# Patient Record
Sex: Male | Born: 1970 | Race: White | Hispanic: No | Marital: Single | Smoking: Former smoker
Health system: Southern US, Community
[De-identification: ages and names within clinical notes are randomized; demographics above are authoritative.]

## PROBLEM LIST (undated history)

## (undated) DIAGNOSIS — F419 Anxiety disorder, unspecified: Secondary | ICD-10-CM

## (undated) DIAGNOSIS — F32A Depression, unspecified: Secondary | ICD-10-CM

## (undated) HISTORY — DX: Depression, unspecified: F32.A

## (undated) HISTORY — DX: Anxiety disorder, unspecified: F41.9

## (undated) HISTORY — PX: CARDIAC SURGERY: SHX584

---

## 2006-03-15 ENCOUNTER — Ambulatory Visit: Payer: Self-pay | Admitting: Family Medicine

## 2006-03-16 ENCOUNTER — Other Ambulatory Visit: Payer: Self-pay

## 2006-03-16 ENCOUNTER — Inpatient Hospital Stay: Payer: Self-pay | Admitting: Internal Medicine

## 2007-03-19 IMAGING — CT CT CHEST W/ CM
1 series · 15 of 33 positions shown, 19 images · non-contrast
Comparison: none

REASON FOR EXAM: Follow-up empyema
COMMENTS:

[Series 2: soft tissue · axial · 0.71mm/px · z∈[-698,-424]mm · 15 of 65 slices shown, 19 images]
[im 5/65  mediastinal]
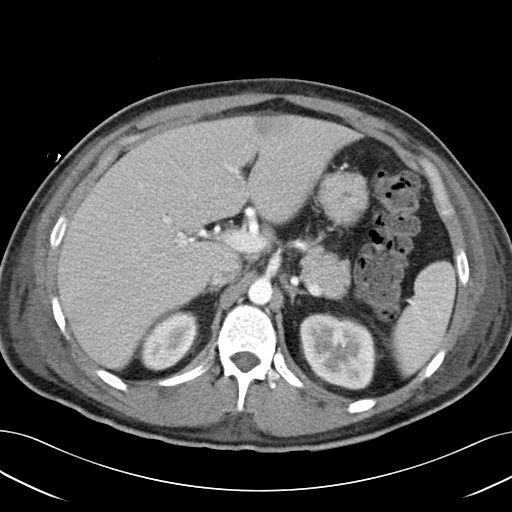
[im 5/65  lung]
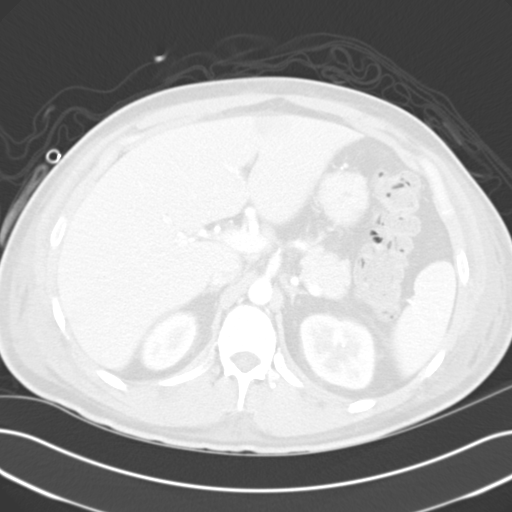
[im 10/65  lung]
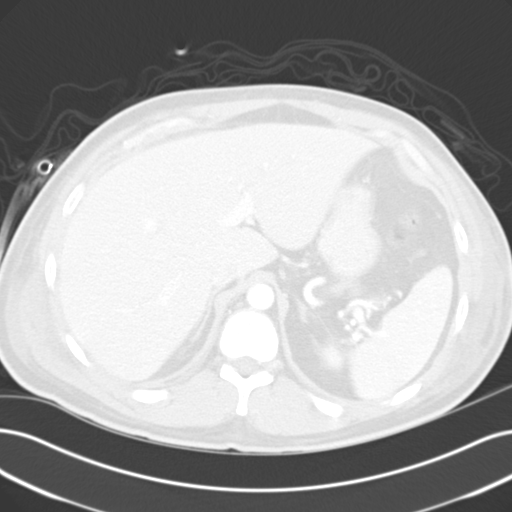
[im 13/65  lung]
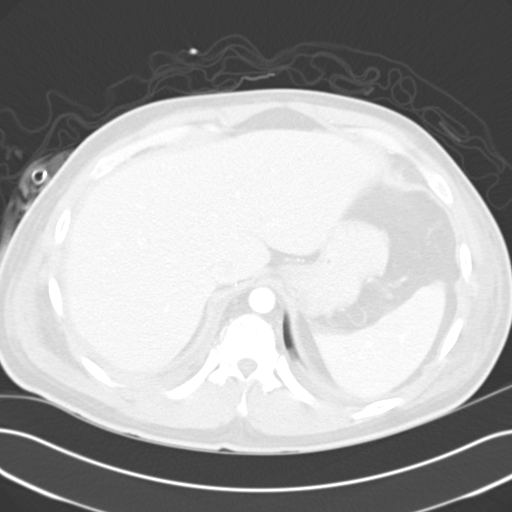
[im 17/65  lung]
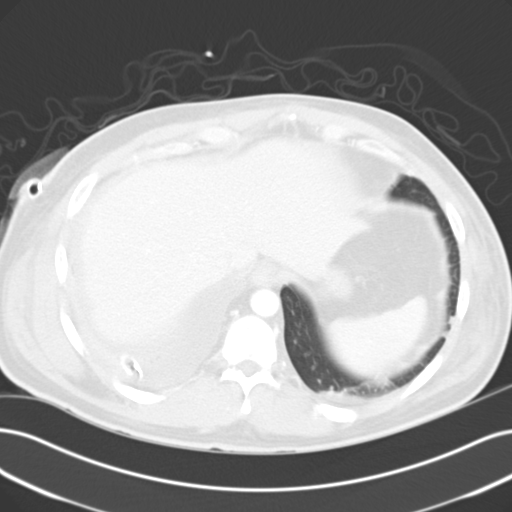
[im 22/65  mediastinal]
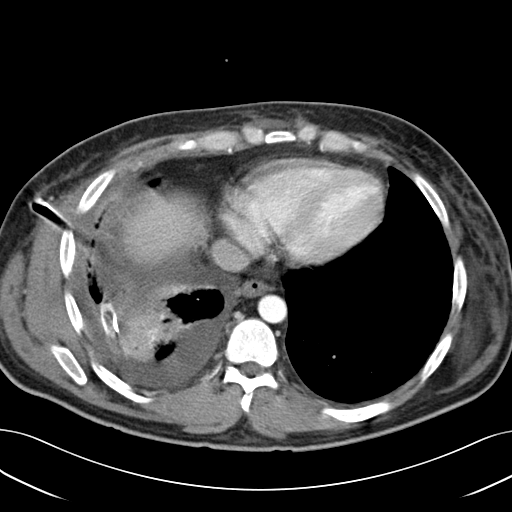
[im 22/65  lung]
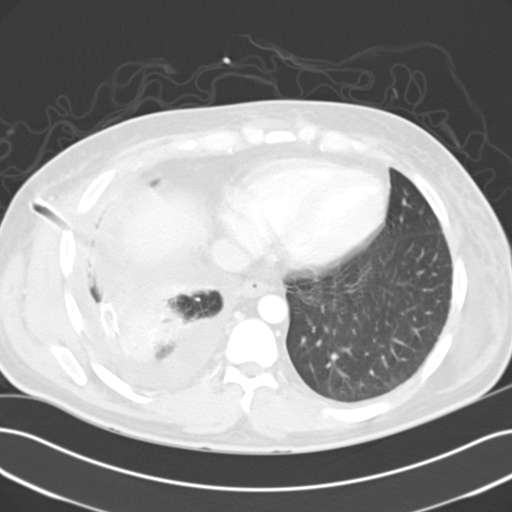
[im 26/65  lung]
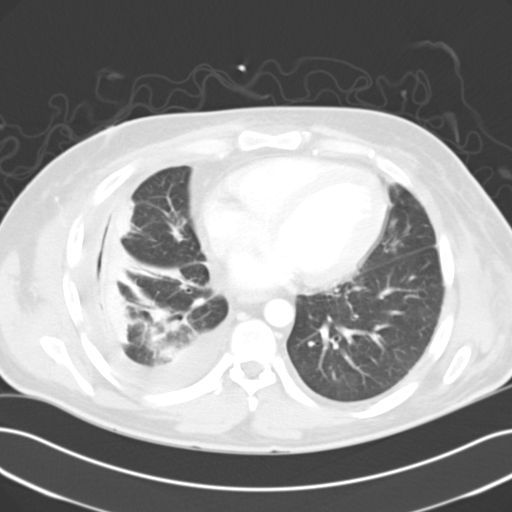
[im 29/65  lung]
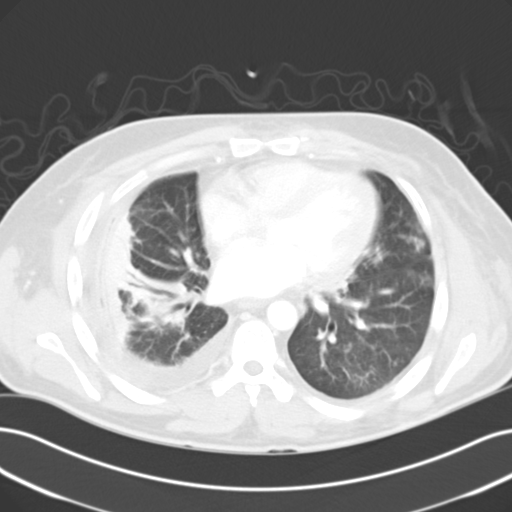
[im 34/65  lung]
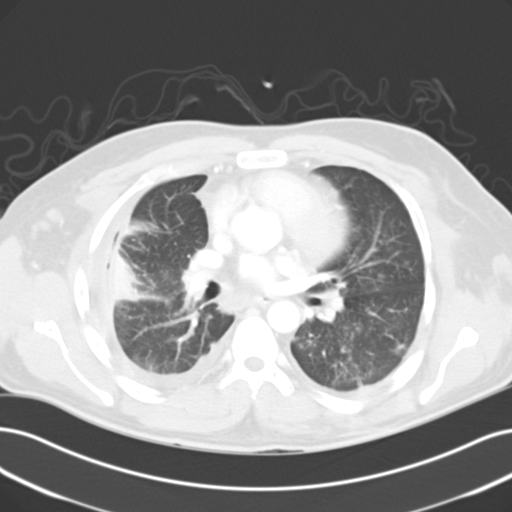
[im 36/65  mediastinal]
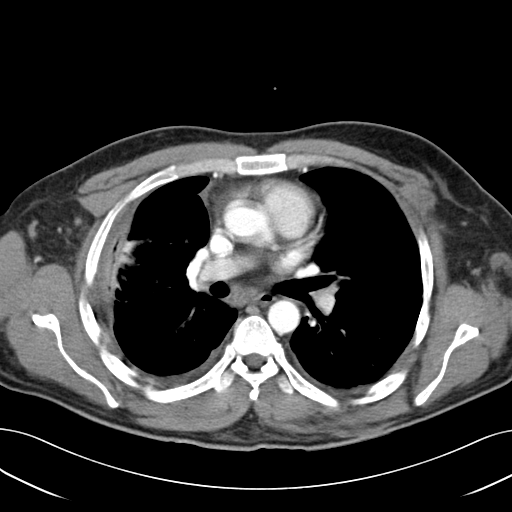
[im 36/65  lung]
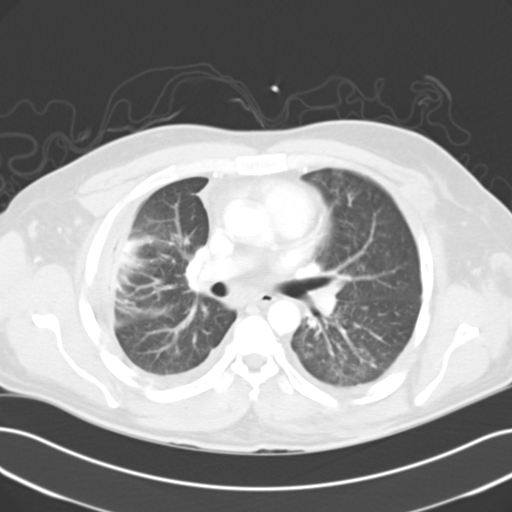
[im 39/65  lung]
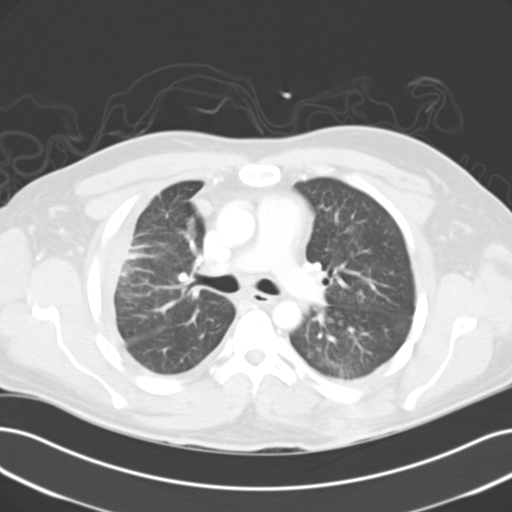
[im 43/65  lung]
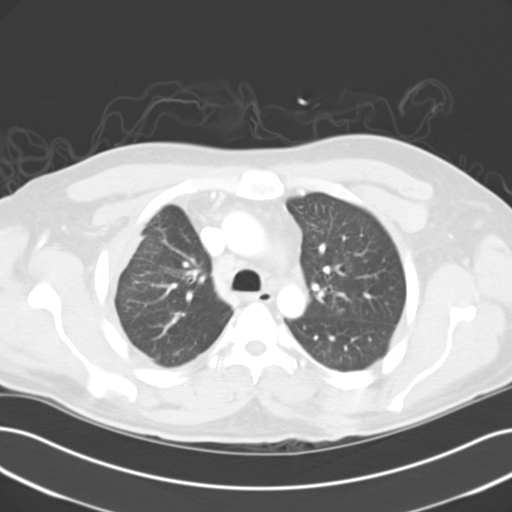
[im 48/65  lung]
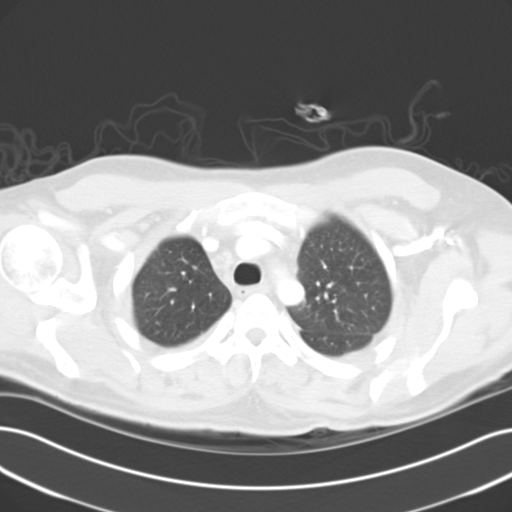
[im 52/65  mediastinal]
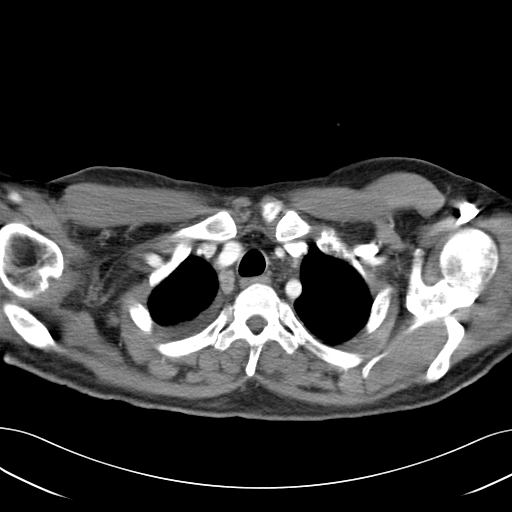
[im 52/65  lung]
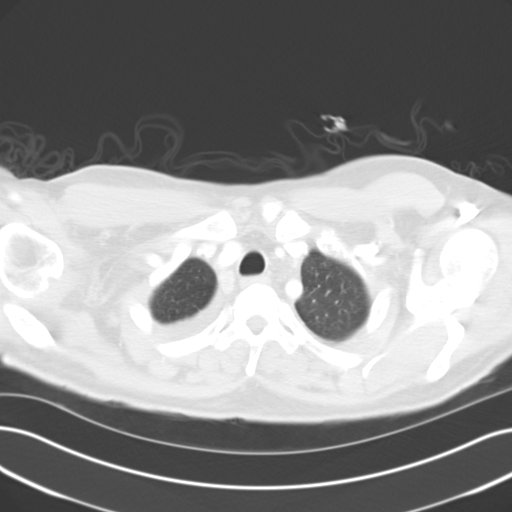
[im 55/65  lung]
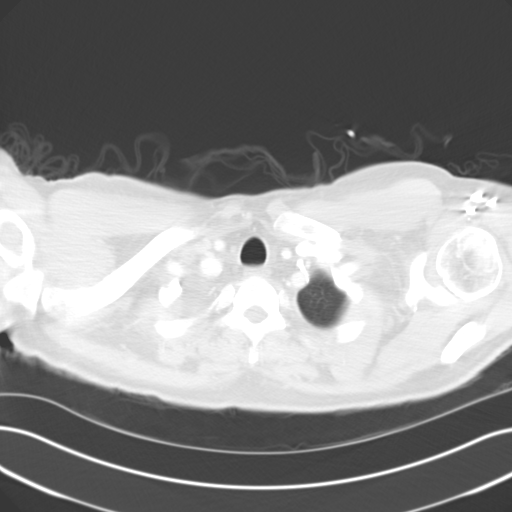
[im 60/65  lung]
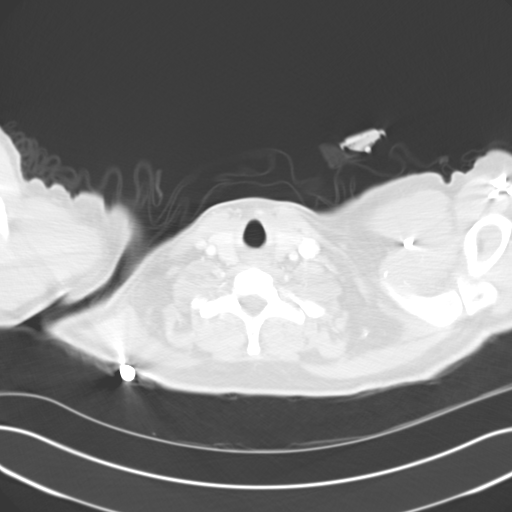

[15 of 33 positions shown; findings below may reference images not displayed]

PROCEDURE:     CT  - CT CHEST WITH CONTRAST  - March 20, 2006  [DATE]

RESULT:     The study is performed in follow-up of  CT scan [DATE].
In the interim, the patient has undergone placement of a large caliber chest
tube on the RIGHT for drainage of an empyema.

The large air/fluid collection has resolved. There is a small amount of
pleural space air and there is a small amount of pleural fluid remaining.
There is atelectatic lung in the region of the previous empyema. On the LEFT
there is a tiny amount of density in the posterior costophrenic gutter on
the LEFT which is new.

The cardiac chambers are top normal in size. The caliber of the thoracic
aorta is normal. There is an enlarged, RIGHT retrosternal lymph node, seen
best on image #26, which has increased in size since the prior study. It
measures approximately 1.0 x 1.5 cm. There are enlarged, subcarinal lymph
nodes present. I do not see bulky hilar lymph nodes. At lung window
settings, the aforementioned changes are noted in the RIGHT lung. On the
LEFT, there is some patchy density in the interstitium of the lower lobe.

Within the upper abdomen, the observed portions of the liver are within the
limits of normal. There are no adrenal masses.
IMPRESSION: 1.     There has been marked improvement in the appearance of the RIGHT
pleural space with the loculated air/fluid level no longer seen. However,
there is a small amount of pleural space air adjacent to the chest tube and
there is a small to moderate sized pleural effusion on the RIGHT. The chest
tube tip lies in the region of the posterior costophrenic gutter on the
RIGHT.
2.     On the LEFT, there is a small, pleural effusion layering posteriorly
and mildly increased interstitial markings have become more conspicuous
since the prior study.
3.     There are enlarged lymph nodes in the retrosternal region as well as
in the subcarinal region and in the RIGHT peritracheal region. These are
more conspicuous than on the prior study.

## 2007-08-31 IMAGING — CR DG CHEST 1V
1 series · 1 of 1 positions shown · non-contrast
Comparison: none

REASON FOR EXAM: empyema
COMMENTS:

[view not recorded]
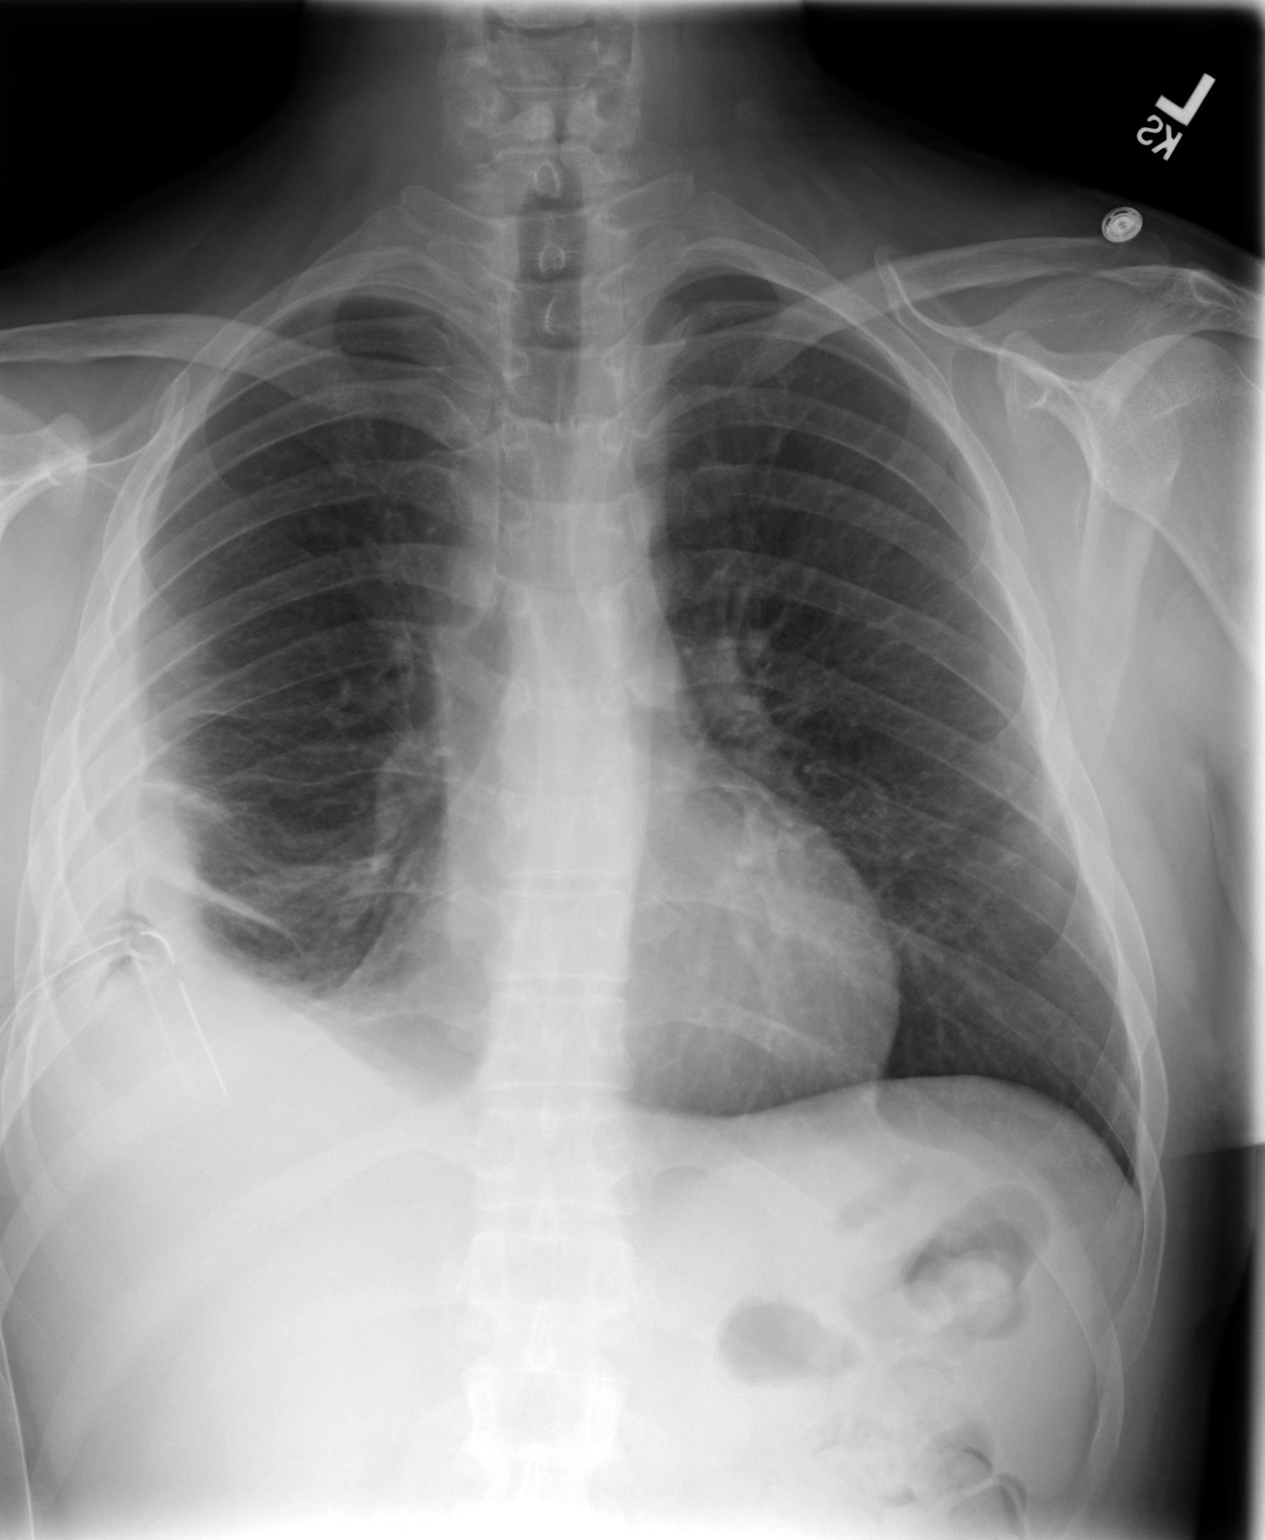

[1 of 1 positions shown; findings below may reference images not displayed]

PROCEDURE:     DXR - DXR CHEST 1 VIEWAP OR PA  - March 26, 2006  [DATE]

RESULT:          Comparison is made to a prior study dated 03/24/2006.

A RIGHT-sided chest tube is appreciated.  Surrounding consolidative density
is again appreciated that, when compared to the previous study, is
relatively unchanged.  The patient is taking a deeper inspiration.  No new
focal regions of consolidation or new focal infiltrates are demonstrated.
The cardiac silhouette is within normal limits.  The visualized bony
skeleton is unremarkable.
IMPRESSION: RIGHT-sided chest tube as described above.  The patient
is taking a deeper inspiration, and otherwise, there is no significant
change in the chest radiograph.

## 2020-11-09 NOTE — Progress Notes (Signed)
BP (!) 144/91   Pulse 79   Temp 97.7 F (36.5 C)   Ht 5' 6.69" (1.694 m)   Wt 176 lb 6 oz (80 kg)   SpO2 99%   BMI 27.88 kg/m    Subjective:    Patient ID: Carl Lowe, male    DOB: May 14, 1971, 50 y.o.   MRN: [id number]  HPI: Carl Lowe is a 50 y.o. male  Chief Complaint  Patient presents with  . Establish Care  . Gastroesophageal Reflux  . Anxiety   Patient presents to clinic to establish care with new PCP.  Patient reports a history of anxiety, hypertension (was previously on HCTZ but has been off of the medication for about 4 years since he got out of prison), constipation when he gets xanax or klonopin from friends, and acid reflux.  Patient states he has had heart surgery but doesn't know what it was at [facility] in [facility].  Patient denies a history of: Elevated Cholesterol, Thyroid problems, Depression, Neurological problems, and Abdominal problems.   ANXIETY/STRESS Patient states it has been going on for 4 months.  He has been self medicating via friends where he gets Xanax.  Duration:worse Anxious mood: yes  Excessive worrying: yes Irritability: yes  Sweating: yes Nausea: no Palpitations:Occasional Hyperventilation: Patient states it has happened 3 times Panic attacks: yes Patient states he has panic attacks 1 per month Agoraphobia: yes  Obscessions/compulsions: no Depressed mood: no Depression screen PHQ 2/9 11/10/2020  Decreased Interest 1  Down, Depressed, Hopeless 3  PHQ - 2 Score 4  Altered sleeping 2  Tired, decreased energy 0  Change in appetite 3  Feeling bad or failure about yourself  0  Trouble concentrating 1  Moving slowly or fidgety/restless 3  Suicidal thoughts 0  PHQ-9 Score 13  Difficult doing work/chores Extremely dIfficult   Anhedonia: no Weight changes: yes Insomnia: yes hard to fall asleep and hard to stay asleep.  Hypersomnia: yes Patient states he will be up for a couple of days at at time and is not able to eat.   Fatigue/loss of energy: no Feelings of worthlessness: no Feelings of guilt: no Impaired concentration/indecisiveness: yes Suicidal ideations: no  Crying spells: yes Recent Stressors/Life Changes: no   Relationship problems: no   Family stress: no     Financial stress: no    Job stress: yes    Recent death/loss: no  HYPERTENSION Patient denies checking blood pressure at home.  Doesn't currently have any symptoms. Denies HA, CP, SOB, dizziness, palpitations, visual changes, and lower extremity swelling.  GERD Patient states that he takes Omeprazole daily.  It doesn't always help to control his symptoms.    Relevant past medical, surgical, family and social history reviewed and updated as indicated. Interim medical history since our last visit reviewed. Allergies and medications reviewed and updated.  Review of Systems  Constitutional: Positive for unexpected weight change.  Eyes: Negative for visual disturbance.  Respiratory: Negative for shortness of breath.   Cardiovascular: Negative for chest pain and leg swelling.  Gastrointestinal:       Reflux  Neurological: Negative for light-headedness and headaches.  Psychiatric/Behavioral: Positive for decreased concentration and sleep disturbance. Negative for dysphoric mood and suicidal ideas. The patient is nervous/anxious.     Per HPI unless specifically indicated above     Objective:    BP (!) 144/91   Pulse 79   Temp 97.7 F (36.5 C)   Ht 5' 6.69" (1.694 m)  Wt 176 lb 6 oz (80 kg)   SpO2 99%   BMI 27.88 kg/m   Wt Readings from Last 3 Encounters:  11/10/20 176 lb 6 oz (80 kg)    Physical Exam Vitals and nursing note reviewed.  Constitutional:      General: He is not in acute distress.    Appearance: Normal appearance. He is not ill-appearing, toxic-appearing or diaphoretic.  HENT:     Head: Normocephalic.     Right Ear: External ear normal.     Left Ear: External ear normal.     Nose: Nose normal. No  congestion or rhinorrhea.     Mouth/Throat:     Mouth: Mucous membranes are moist.  Eyes:     General:        Right eye: No discharge.        Left eye: No discharge.     Extraocular Movements: Extraocular movements intact.     Conjunctiva/sclera: Conjunctivae normal.     Pupils: Pupils are equal, round, and reactive to light.  Cardiovascular:     Rate and Rhythm: Normal rate and regular rhythm.     Heart sounds: No murmur heard.   Pulmonary:     Effort: Pulmonary effort is normal. No respiratory distress.     Breath sounds: Normal breath sounds. No wheezing, rhonchi or rales.  Abdominal:     General: Abdomen is flat. Bowel sounds are normal.  Musculoskeletal:     Cervical back: Normal range of motion and neck supple.  Skin:    General: Skin is warm and dry.     Capillary Refill: Capillary refill takes less than 2 seconds.  Neurological:     General: No focal deficit present.     Mental Status: He is alert and oriented to person, place, and time.  Psychiatric:        Mood and Affect: Affect is blunt.        Speech: Speech is delayed.        Behavior: Behavior normal.        Thought Content: Thought content normal.        Judgment: Judgment normal.     No results found for this or any previous visit.    Assessment & Plan:   Problem List Items Addressed This Visit      Cardiovascular and Mediastinum   Hypertension    Chronic.  Uncontrolled.  Will start Lisinopril 10mg  daily.  Side effects and benefits of medication discussed with patient during visit. Return in one month for reevaluation.       Relevant Medications   lisinopril (ZESTRIL) 10 MG tablet     Digestive   Acid reflux    Chronic.  Increased omeprazole to 40mg  due to patient still having symptoms of reflux.  If symptoms are still not controlled can consider referral to GI for endoscopy. Follow up in 1 month for reevaluation.       Relevant Medications   omeprazole (PRILOSEC) 40 MG capsule     Other    Anxiety    Chronic.  Patient referred to psychiatry for management of anxiety.  Follow up in 1 month.       Relevant Orders   Ambulatory referral to Psychiatry    Other Visit Diagnoses    Encounter to establish care    -  Primary       Follow up plan: Return in about 1 month (around 12/10/2020) for BP Check, Depression/Anxiety FU.   A total  of 40 minutes were spent on this encounter today.  When total time is documented, this includes both the face-to-face and non-face-to-face time personally spent before, during and after the visit on the date of the encounter.

## 2020-11-10 ENCOUNTER — Ambulatory Visit (INDEPENDENT_AMBULATORY_CARE_PROVIDER_SITE_OTHER): Payer: Self-pay | Admitting: Nurse Practitioner

## 2020-11-10 ENCOUNTER — Other Ambulatory Visit: Payer: Self-pay

## 2020-11-10 ENCOUNTER — Encounter: Payer: Self-pay | Admitting: Nurse Practitioner

## 2020-11-10 VITALS — BP 144/91 | HR 79 | Temp 97.7°F | Ht 66.69 in | Wt 176.4 lb

## 2020-11-10 DIAGNOSIS — K219 Gastro-esophageal reflux disease without esophagitis: Secondary | ICD-10-CM | POA: Insufficient documentation

## 2020-11-10 DIAGNOSIS — F419 Anxiety disorder, unspecified: Secondary | ICD-10-CM

## 2020-11-10 DIAGNOSIS — I10 Essential (primary) hypertension: Secondary | ICD-10-CM | POA: Insufficient documentation

## 2020-11-10 DIAGNOSIS — Z7689 Persons encountering health services in other specified circumstances: Secondary | ICD-10-CM

## 2020-11-10 MED ORDER — LISINOPRIL 10 MG PO TABS: 10.0000 mg | ORAL_TABLET | Freq: Every day | ORAL | 0 refills | Status: DC

## 2020-11-10 MED ORDER — OMEPRAZOLE 40 MG PO CPDR: 40.0000 mg | DELAYED_RELEASE_CAPSULE | Freq: Every day | ORAL | 1 refills | Status: DC

## 2020-11-10 NOTE — Assessment & Plan Note (Signed)
Chronic.  Uncontrolled.  Will start Lisinopril 10mg  daily.  Side effects and benefits of medication discussed with patient during visit. Return in one month for reevaluation.

## 2020-11-10 NOTE — Assessment & Plan Note (Signed)
Chronic.  Increased omeprazole to 40mg  due to patient still having symptoms of reflux.  If symptoms are still not controlled can consider referral to GI for endoscopy. Follow up in 1 month for reevaluation.

## 2020-11-10 NOTE — Assessment & Plan Note (Signed)
Chronic.  Patient referred to psychiatry for management of anxiety.  Follow up in 1 month.

## 2020-12-09 NOTE — Progress Notes (Deleted)
   There were no vitals taken for this visit.   Subjective:    Patient ID: Carl Lowe, male    DOB: July 15, 1971, 50 y.o.   MRN: [id number]  HPI: Carl Lowe is a 50 y.o. male  No chief complaint on file.  HYPERTENSION Hypertension status: {Blank single:19197::"controlled","uncontrolled","better","worse","exacerbated","stable"}  Satisfied with current treatment? {Blank single:19197::"yes","no"} Duration of hypertension: {Blank single:19197::"chronic","months","years"} BP monitoring frequency:  {Blank single:19197::"not checking","rarely","daily","weekly","monthly","a few times a day","a few times a week","a few times a month"} BP range:  BP medication side effects:  {Blank single:19197::"yes","no"} Medication compliance: {Blank single:19197::"excellent compliance","good compliance","fair compliance","poor compliance"} Previous BP meds:{Blank multiple:19196::"none","amlodipine","amlodipine/benazepril","atenolol","benazepril","benazepril/HCTZ","bisoprolol (bystolic)","carvedilol","chlorthalidone","clonidine","diltiazem","exforge HCT","HCTZ","irbesartan (avapro)","labetalol","lisinopril","lisinopril-HCTZ","losartan (cozaar)","methyldopa","nifedipine","olmesartan (benicar)","olmesartan-HCTZ","quinapril","ramipril","spironalactone","tekturna","valsartan","valsartan-HCTZ","verapamil"} Aspirin: {Blank single:19197::"yes","no"} Recurrent headaches: {Blank single:19197::"yes","no"} Visual changes: {Blank single:19197::"yes","no"} Palpitations: {Blank single:19197::"yes","no"} Dyspnea: {Blank single:19197::"yes","no"} Chest pain: {Blank single:19197::"yes","no"} Lower extremity edema: {Blank single:19197::"yes","no"} Dizzy/lightheaded: {Blank single:19197::"yes","no"}  ANXIETY  Relevant past medical, surgical, family and social history reviewed and updated as indicated. Interim medical history since our last visit reviewed. Allergies and medications reviewed and  updated.  Review of Systems  Per HPI unless specifically indicated above     Objective:    There were no vitals taken for this visit.  Wt Readings from Last 3 Encounters:  11/10/20 176 lb 6 oz (80 kg)    Physical Exam  No results found for this or any previous visit.    Assessment & Plan:   Problem List Items Addressed This Visit      Cardiovascular and Mediastinum   Hypertension - Primary     Other   Anxiety       Follow up plan: No follow-ups on file.

## 2020-12-10 ENCOUNTER — Telehealth: Payer: Self-pay | Admitting: Nurse Practitioner

## 2020-12-10 ENCOUNTER — Other Ambulatory Visit: Payer: Self-pay

## 2020-12-10 ENCOUNTER — Ambulatory Visit: Payer: Self-pay | Admitting: Nurse Practitioner

## 2020-12-10 DIAGNOSIS — I10 Essential (primary) hypertension: Secondary | ICD-10-CM

## 2020-12-10 DIAGNOSIS — F419 Anxiety disorder, unspecified: Secondary | ICD-10-CM

## 2020-12-10 MED ORDER — LISINOPRIL 10 MG PO TABS: 10.0000 mg | ORAL_TABLET | Freq: Every day | ORAL | 0 refills | Status: DC

## 2020-12-10 NOTE — Telephone Encounter (Signed)
Refill sent for patient.  He has only been on the medication for 4 days and then he lost the pills.  Will follow up in 1 month.

## 2021-01-11 ENCOUNTER — Ambulatory Visit: Payer: Self-pay | Admitting: Nurse Practitioner

## 2021-01-11 NOTE — Progress Notes (Deleted)
   There were no vitals taken for this visit.   Subjective:    Patient ID: Carl Lowe, male    DOB: 04-18-1971, 50 y.o.   MRN: [id number]  HPI: Carl Lowe is a 50 y.o. male  No chief complaint on file.  HYPERTENSION Hypertension status: {Blank single:19197::"controlled","uncontrolled","better","worse","exacerbated","stable"}  Satisfied with current treatment? {Blank single:19197::"yes","no"} Duration of hypertension: {Blank single:19197::"chronic","months","years"} BP monitoring frequency:  {Blank single:19197::"not checking","rarely","daily","weekly","monthly","a few times a day","a few times a week","a few times a month"} BP range:  BP medication side effects:  {Blank single:19197::"yes","no"} Medication compliance: {Blank single:19197::"excellent compliance","good compliance","fair compliance","poor compliance"} Previous BP meds:{Blank multiple:19196::"none","amlodipine","amlodipine/benazepril","atenolol","benazepril","benazepril/HCTZ","bisoprolol (bystolic)","carvedilol","chlorthalidone","clonidine","diltiazem","exforge HCT","HCTZ","irbesartan (avapro)","labetalol","lisinopril","lisinopril-HCTZ","losartan (cozaar)","methyldopa","nifedipine","olmesartan (benicar)","olmesartan-HCTZ","quinapril","ramipril","spironalactone","tekturna","valsartan","valsartan-HCTZ","verapamil"} Aspirin: {Blank single:19197::"yes","no"} Recurrent headaches: {Blank single:19197::"yes","no"} Visual changes: {Blank single:19197::"yes","no"} Palpitations: {Blank single:19197::"yes","no"} Dyspnea: {Blank single:19197::"yes","no"} Chest pain: {Blank single:19197::"yes","no"} Lower extremity edema: {Blank single:19197::"yes","no"} Dizzy/lightheaded: {Blank single:19197::"yes","no"}  DEPRESSION/ANXIETY Relevant past medical, surgical, family and social history reviewed and updated as indicated. Interim medical history since our last visit reviewed. Allergies and medications reviewed and  updated.  Review of Systems  Per HPI unless specifically indicated above     Objective:    There were no vitals taken for this visit.  Wt Readings from Last 3 Encounters:  11/10/20 176 lb 6 oz (80 kg)    Physical Exam  No results found for this or any previous visit.    Assessment & Plan:   Problem List Items Addressed This Visit       Cardiovascular and Mediastinum   Hypertension - Primary     Other   Anxiety     Follow up plan: No follow-ups on file.

## 2021-01-27 ENCOUNTER — Ambulatory Visit
Admission: EM | Admit: 2021-01-27 | Discharge: 2021-01-27 | Disposition: A | Discharge status: Home / Self Care | Payer: Self-pay | Attending: Sports Medicine | Admitting: Sports Medicine

## 2021-01-27 ENCOUNTER — Other Ambulatory Visit: Payer: Self-pay

## 2021-01-27 ENCOUNTER — Encounter: Payer: Self-pay | Admitting: Emergency Medicine

## 2021-01-27 DIAGNOSIS — F419 Anxiety disorder, unspecified: Secondary | ICD-10-CM

## 2021-01-27 NOTE — ED Triage Notes (Signed)
Pt presents today with c/o anxiety and insomnia. He reports chronic anxiety with an increase in anxiety causing insomnia. He has not been able to sleep in three days.

## 2021-01-27 NOTE — ED Provider Notes (Signed)
[facility]    CSN: [id number] Arrival date & time: 01/27/21  1239      History   Chief Complaint Chief Complaint  Patient presents with   Anxiety    HPI Carl Lowe is a 50 y.o. male.   Patient presents looking for a prescription for Xanax.  Review of the medical record indicates that he does have a primary care provider that he was supposed to follow-up with in May and has not done so.  She referred him to psychiatry and he has not followed up there.  I told the patient that I was not comfortable prescribing any mood altering medications to him.  He needs to go through psychiatry to get them.  Patient was not examined and not evaluated.  This will be a no charge visit.  I advised him to call his primary care provider and get seen as soon as possible discussed all options for his mental health care.   Past Medical History:  Diagnosis Date   Anxiety    Depression     Patient Active Problem List   Diagnosis Date Noted   Hypertension 11/10/2020   Acid reflux 11/10/2020   Anxiety 11/10/2020    Past Surgical History:  Procedure Laterality Date   CARDIAC SURGERY         Home Medications    Prior to Admission medications   Medication Sig Start Date End Date Taking? Authorizing Provider  lisinopril (ZESTRIL) 10 MG tablet Take 1 tablet (10 mg total) by mouth daily. Patient not taking: Reported on 01/27/2021 12/10/20   Larae Grooms, NP  omeprazole (PRILOSEC) 40 MG capsule Take 1 capsule (40 mg total) by mouth daily. 11/10/20   Larae Grooms, NP    Family History Family History  Problem Relation Age of Onset   Hypertension Mother    Bipolar disorder Father    Heart disease Father    Dementia Father    COPD Father    Anxiety disorder Brother     Social History Social History   Tobacco Use   Smoking status: Former    Pack years: 0.00   Smokeless tobacco: Current    Types: Snuff  Vaping Use   Vaping Use: Never used  Substance  Use Topics   Alcohol use: Never   Drug use: Yes    Comment: clonapen, xanax "any benzos" off the street, doesnt' want to do that anymore     Allergies   Patient has no known allergies.   Review of Systems Review of Systems   Physical Exam Triage Vital Signs ED Triage Vitals [01/27/21 1313]  Enc Vitals Group     BP      Pulse      Resp      Temp      Temp src      SpO2      Weight      Height      Head Circumference      Peak Flow      Pain Score 0     Pain Loc      Pain Edu?      Excl. in GC?    No data found.  Updated Vital Signs BP (!) 150/106 (BP Location: Left Arm)   Pulse 82   Temp 98.2 F (36.8 C) (Oral)   Resp 20   SpO2 100%   Visual Acuity Right Eye Distance:   Left Eye Distance:   Bilateral Distance:    Right  Eye Near:   Left Eye Near:    Bilateral Near:     Physical Exam   UC Treatments / Results  Labs (all labs ordered are listed, but only abnormal results are displayed) Labs Reviewed - No data to display  EKG   Radiology No results found.  Procedures Procedures (including critical care time)  Medications Ordered in UC Medications - No data to display  Initial Impression / Assessment and Plan / UC Course  I have reviewed the triage vital signs and the nursing notes.  Pertinent labs & imaging results that were available during my care of the patient were reviewed by me and considered in my medical decision making (see chart for details).    Final Clinical Impressions(s) / UC Diagnoses   Final diagnoses:  Anxiety   Discharge Instructions   None    ED Prescriptions   None    PDMP not reviewed this encounter.   Delton See, MD 01/27/21 1345

## 2022-02-11 DIAGNOSIS — R69 Illness, unspecified: Secondary | ICD-10-CM | POA: Diagnosis not present

## 2022-02-11 DIAGNOSIS — F411 Generalized anxiety disorder: Secondary | ICD-10-CM | POA: Diagnosis not present

## 2022-02-11 DIAGNOSIS — F4310 Post-traumatic stress disorder, unspecified: Secondary | ICD-10-CM | POA: Diagnosis not present

## 2022-05-31 DIAGNOSIS — F411 Generalized anxiety disorder: Secondary | ICD-10-CM | POA: Diagnosis not present

## 2022-05-31 DIAGNOSIS — R69 Illness, unspecified: Secondary | ICD-10-CM | POA: Diagnosis not present

## 2022-05-31 DIAGNOSIS — F339 Major depressive disorder, recurrent, unspecified: Secondary | ICD-10-CM | POA: Diagnosis not present

## 2022-06-05 ENCOUNTER — Emergency Department: Payer: 59

## 2022-06-05 ENCOUNTER — Inpatient Hospital Stay
Admission: EM | Admit: 2022-06-05 | Discharge: 2022-06-06 | DRG: 885 | Disposition: A | Discharge status: Rehabilitation Facility | Payer: 59 | Attending: Internal Medicine | Admitting: Internal Medicine

## 2022-06-05 ENCOUNTER — Other Ambulatory Visit: Payer: Self-pay

## 2022-06-05 DIAGNOSIS — G928 Other toxic encephalopathy: Secondary | ICD-10-CM | POA: Diagnosis present

## 2022-06-05 DIAGNOSIS — M6282 Rhabdomyolysis: Secondary | ICD-10-CM | POA: Diagnosis not present

## 2022-06-05 DIAGNOSIS — F419 Anxiety disorder, unspecified: Secondary | ICD-10-CM | POA: Diagnosis not present

## 2022-06-05 DIAGNOSIS — F23 Brief psychotic disorder: Secondary | ICD-10-CM | POA: Diagnosis not present

## 2022-06-05 DIAGNOSIS — Z79899 Other long term (current) drug therapy: Secondary | ICD-10-CM | POA: Diagnosis not present

## 2022-06-05 DIAGNOSIS — R69 Illness, unspecified: Secondary | ICD-10-CM | POA: Diagnosis not present

## 2022-06-05 DIAGNOSIS — Z20822 Contact with and (suspected) exposure to covid-19: Secondary | ICD-10-CM | POA: Diagnosis not present

## 2022-06-05 DIAGNOSIS — F32A Depression, unspecified: Secondary | ICD-10-CM | POA: Diagnosis not present

## 2022-06-05 DIAGNOSIS — R45851 Suicidal ideations: Secondary | ICD-10-CM | POA: Diagnosis not present

## 2022-06-05 DIAGNOSIS — Z8249 Family history of ischemic heart disease and other diseases of the circulatory system: Secondary | ICD-10-CM | POA: Diagnosis not present

## 2022-06-05 DIAGNOSIS — R0902 Hypoxemia: Secondary | ICD-10-CM | POA: Diagnosis not present

## 2022-06-05 DIAGNOSIS — M4182 Other forms of scoliosis, cervical region: Secondary | ICD-10-CM | POA: Diagnosis not present

## 2022-06-05 DIAGNOSIS — Z818 Family history of other mental and behavioral disorders: Secondary | ICD-10-CM

## 2022-06-05 DIAGNOSIS — R410 Disorientation, unspecified: Secondary | ICD-10-CM

## 2022-06-05 DIAGNOSIS — F410 Panic disorder [episodic paroxysmal anxiety] without agoraphobia: Secondary | ICD-10-CM | POA: Diagnosis not present

## 2022-06-05 DIAGNOSIS — M50322 Other cervical disc degeneration at C5-C6 level: Secondary | ICD-10-CM | POA: Diagnosis not present

## 2022-06-05 DIAGNOSIS — G9340 Encephalopathy, unspecified: Secondary | ICD-10-CM | POA: Diagnosis present

## 2022-06-05 DIAGNOSIS — J9601 Acute respiratory failure with hypoxia: Secondary | ICD-10-CM | POA: Diagnosis present

## 2022-06-05 DIAGNOSIS — R9431 Abnormal electrocardiogram [ECG] [EKG]: Secondary | ICD-10-CM | POA: Diagnosis not present

## 2022-06-05 DIAGNOSIS — R4182 Altered mental status, unspecified: Secondary | ICD-10-CM | POA: Diagnosis not present

## 2022-06-05 DIAGNOSIS — F17220 Nicotine dependence, chewing tobacco, uncomplicated: Secondary | ICD-10-CM | POA: Diagnosis present

## 2022-06-05 DIAGNOSIS — R443 Hallucinations, unspecified: Secondary | ICD-10-CM | POA: Diagnosis not present

## 2022-06-05 DIAGNOSIS — N179 Acute kidney failure, unspecified: Secondary | ICD-10-CM | POA: Diagnosis present

## 2022-06-05 DIAGNOSIS — M50323 Other cervical disc degeneration at C6-C7 level: Secondary | ICD-10-CM | POA: Diagnosis not present

## 2022-06-05 DIAGNOSIS — M4802 Spinal stenosis, cervical region: Secondary | ICD-10-CM | POA: Diagnosis not present

## 2022-06-05 LAB — COMPREHENSIVE METABOLIC PANEL
ALT: 26 U/L (ref 0–44)
AST: 53 U/L — ABNORMAL HIGH (ref 15–41)
Albumin: 4.9 g/dL (ref 3.5–5.0)
Alkaline Phosphatase: 64 U/L (ref 38–126)
Anion gap: 14 (ref 5–15)
BUN: 22 mg/dL — ABNORMAL HIGH (ref 6–20)
CO2: 19 mmol/L — ABNORMAL LOW (ref 22–32)
Calcium: 9.6 mg/dL (ref 8.9–10.3)
Chloride: 102 mmol/L (ref 98–111)
Creatinine, Ser: 1.35 mg/dL — ABNORMAL HIGH (ref 0.61–1.24)
GFR, Estimated: >60 mL/min (ref >60)
Glucose, Bld: 102 mg/dL — ABNORMAL HIGH (ref 70–99)
Potassium: 3.9 mmol/L (ref 3.5–5.1)
Sodium: 135 mmol/L (ref 135–145)
Total Bilirubin: 1.4 mg/dL — ABNORMAL HIGH (ref 0.3–1.2)
Total Protein: 8.9 g/dL — ABNORMAL HIGH (ref 6.5–8.1)

## 2022-06-05 LAB — BLOOD GAS, VENOUS
Acid-Base Excess: 3.2 mmol/L — ABNORMAL HIGH (ref 0.0–2.0)
Bicarbonate: 27.9 mmol/L (ref 20.0–28.0)
O2 Saturation: 95.6 %
Patient temperature: 37
pCO2, Ven: 42 mmHg — ABNORMAL LOW (ref 44–60)
pH, Ven: 7.43 (ref 7.25–7.43)
pO2, Ven: 69 mmHg — ABNORMAL HIGH (ref 32–45)

## 2022-06-05 LAB — URINE DRUG SCREEN, QUALITATIVE (ARMC ONLY)
Amphetamines, Ur Screen: NOT DETECTED
Barbiturates, Ur Screen: NOT DETECTED
Benzodiazepine, Ur Scrn: POSITIVE — AB
Cannabinoid 50 Ng, Ur ~~LOC~~: POSITIVE — AB
Cocaine Metabolite,Ur ~~LOC~~: NOT DETECTED
MDMA (Ecstasy)Ur Screen: NOT DETECTED
Methadone Scn, Ur: NOT DETECTED
Opiate, Ur Screen: NOT DETECTED
Phencyclidine (PCP) Ur S: NOT DETECTED
Tricyclic, Ur Screen: POSITIVE — AB

## 2022-06-05 LAB — CK: Total CK: 730 U/L — ABNORMAL HIGH (ref 49–397)

## 2022-06-05 LAB — ETHANOL: Alcohol, Ethyl (B): <10 mg/dL (ref <10)

## 2022-06-05 LAB — ACETAMINOPHEN LEVEL: Acetaminophen (Tylenol), Serum: <10 ug/mL — ABNORMAL LOW (ref 10–30)

## 2022-06-05 LAB — PHOSPHORUS: Phosphorus: 4.7 mg/dL — ABNORMAL HIGH (ref 2.5–4.6)

## 2022-06-05 LAB — SALICYLATE LEVEL: Salicylate Lvl: <7 mg/dL — ABNORMAL LOW (ref 7.0–30.0)

## 2022-06-05 LAB — RESP PANEL BY RT-PCR (FLU A&B, COVID) ARPGX2
Influenza A by PCR: NEGATIVE
Influenza B by PCR: NEGATIVE
SARS Coronavirus 2 by RT PCR: NEGATIVE

## 2022-06-05 LAB — GLUCOSE, CAPILLARY: Glucose-Capillary: 84 mg/dL (ref 70–99)

## 2022-06-05 LAB — MRSA NEXT GEN BY PCR, NASAL: MRSA by PCR Next Gen: NOT DETECTED

## 2022-06-05 LAB — MAGNESIUM: Magnesium: 2.3 mg/dL (ref 1.7–2.4)

## 2022-06-05 LAB — PROCALCITONIN: Procalcitonin: <0.1 ng/mL

## 2022-06-05 MED ORDER — LORAZEPAM 2 MG/ML IJ SOLN
4.0000 mg | Freq: Once | INTRAMUSCULAR | Status: DC | PRN
  Filled 2022-06-05: qty 2

## 2022-06-05 MED ORDER — DOCUSATE SODIUM 100 MG PO CAPS: 100.0000 mg | ORAL_CAPSULE | Freq: Two times a day (BID) | ORAL | Status: DC | PRN

## 2022-06-05 MED ORDER — LACTATED RINGERS IV SOLN: INTRAVENOUS | Status: DC

## 2022-06-05 MED ORDER — ONDANSETRON HCL 4 MG/2ML IJ SOLN: 4.0000 mg | Freq: Four times a day (QID) | INTRAMUSCULAR | Status: DC | PRN

## 2022-06-05 MED ORDER — LORAZEPAM 2 MG/ML IJ SOLN
2.0000 mg | Freq: Once | INTRAMUSCULAR | Status: AC
  Administered 2022-06-05: 2 mg via INTRAMUSCULAR
  Filled 2022-06-05: qty 1

## 2022-06-05 MED ORDER — DROPERIDOL 2.5 MG/ML IJ SOLN
5.0000 mg | Freq: Once | INTRAMUSCULAR | Status: AC
  Administered 2022-06-05: 5 mg via INTRAMUSCULAR
  Filled 2022-06-05: qty 2

## 2022-06-05 MED ORDER — DIPHENHYDRAMINE HCL 50 MG/ML IJ SOLN
50.0000 mg | Freq: Once | INTRAMUSCULAR | Status: AC
  Administered 2022-06-05: 50 mg via INTRAMUSCULAR
  Filled 2022-06-05: qty 1

## 2022-06-05 MED ORDER — LORAZEPAM 2 MG/ML IJ SOLN: 2.0000 mg | Freq: Once | INTRAMUSCULAR | Status: DC

## 2022-06-05 MED ORDER — ZIPRASIDONE MESYLATE 20 MG IM SOLR
20.0000 mg | Freq: Once | INTRAMUSCULAR | Status: AC
  Administered 2022-06-05: 20 mg via INTRAMUSCULAR
  Filled 2022-06-05: qty 20

## 2022-06-05 MED ORDER — POLYETHYLENE GLYCOL 3350 17 G PO PACK: 17.0000 g | PACK | Freq: Every day | ORAL | Status: DC | PRN

## 2022-06-05 MED ORDER — LORAZEPAM 2 MG/ML IJ SOLN
1.0000 mg | Freq: Once | INTRAMUSCULAR | Status: AC
  Administered 2022-06-05: 1 mg via INTRAMUSCULAR
  Filled 2022-06-05: qty 1

## 2022-06-05 MED ORDER — ENOXAPARIN SODIUM 40 MG/0.4ML IJ SOSY
40.0000 mg | PREFILLED_SYRINGE | INTRAMUSCULAR | Status: DC
  Administered 2022-06-05: 40 mg via SUBCUTANEOUS
  Filled 2022-06-05: qty 0.4

## 2022-06-05 MED ORDER — ACETAMINOPHEN 325 MG PO TABS: 650.0000 mg | ORAL_TABLET | ORAL | Status: DC | PRN

## 2022-06-05 MED ORDER — LORAZEPAM 2 MG/ML IJ SOLN: 1.0000 mg | Freq: Once | INTRAMUSCULAR | Status: DC

## 2022-06-05 MED ORDER — CHLORHEXIDINE GLUCONATE CLOTH 2 % EX PADS
6.0000 | MEDICATED_PAD | Freq: Every day | CUTANEOUS | Status: DC
  Administered 2022-06-05 – 2022-06-06 (×2): 6 via TOPICAL

## 2022-06-05 MED ORDER — THIAMINE HCL 100 MG/ML IJ SOLN
100.0000 mg | Freq: Once | INTRAMUSCULAR | Status: AC
  Administered 2022-06-05: 100 mg via INTRAVENOUS
  Filled 2022-06-05: qty 2

## 2022-06-05 MED ORDER — DROPERIDOL 2.5 MG/ML IJ SOLN: 5.0000 mg | Freq: Once | INTRAMUSCULAR | Status: DC

## 2022-06-05 MED ORDER — MIDAZOLAM HCL 2 MG/2ML IJ SOLN
4.0000 mg | Freq: Once | INTRAMUSCULAR | Status: AC
  Administered 2022-06-05: 4 mg via INTRAMUSCULAR
  Filled 2022-06-05: qty 4

## 2022-06-05 MED ORDER — LORAZEPAM 2 MG/ML IJ SOLN
2.0000 mg | Freq: Once | INTRAMUSCULAR | Status: AC
  Administered 2022-06-05: 2 mg via INTRAVENOUS
  Filled 2022-06-05: qty 1

## 2022-06-05 MED ORDER — FOLIC ACID 5 MG/ML IJ SOLN
1.0000 mg | Freq: Every day | INTRAMUSCULAR | Status: DC
  Administered 2022-06-05 – 2022-06-06 (×2): 1 mg via INTRAVENOUS
  Filled 2022-06-05 (×2): qty 0.2

## 2022-06-05 MED ORDER — DEXMEDETOMIDINE HCL IN NACL 400 MCG/100ML IV SOLN
0.4000 ug/kg/h | INTRAVENOUS | Status: DC
  Administered 2022-06-05: 1 ug/kg/h via INTRAVENOUS
  Administered 2022-06-05: 0.4 ug/kg/h via INTRAVENOUS
  Administered 2022-06-05: 0.8 ug/kg/h via INTRAVENOUS
  Administered 2022-06-05: 1 ug/kg/h via INTRAVENOUS
  Administered 2022-06-06: 0.9 ug/kg/h via INTRAVENOUS
  Filled 2022-06-05 (×5): qty 100

## 2022-06-05 NOTE — ED Notes (Signed)
Pt continues to need constant redirection. He is attempting to get out of bed and still exhibiting paranoia. [employer] officer and NT remain at bedside. Provider aware.

## 2022-06-05 NOTE — ED Notes (Signed)
Received report from Nadine, RN after assisting with IM medications- pt in bed with security at bedside attempting to keep pt in bed- d/t the amount of medications this pt has received, plan is to move pt to a treatment room for closer monitoring- charge RN is aware and making arrangements

## 2022-06-05 NOTE — ED Notes (Signed)
Dr Wong at bedside

## 2022-06-05 NOTE — ED Notes (Signed)
Pt changed into facility scrubs and socks and belongings secured.

## 2022-06-05 NOTE — ED Notes (Signed)
EDP and RN will bring pt to CT. PRN ativan available if needed. Pt is resting slightly now.

## 2022-06-05 NOTE — ED Notes (Addendum)
Pt thrashing about in wheelchair.  Refuses to sit still for vital signs.  Pt states he can't stand or walk

## 2022-06-05 NOTE — ED Notes (Signed)
Pt talking to wall and pillow. Most of pt's conversations are about his warning of impending war. Pt picking/reaching at air and floor "catching" unknown objects

## 2022-06-05 NOTE — H&P (Signed)
NAME:  Carl Lowe, MRN:  [id number], DOB:  November 14, 1970, LOS: 0 ADMISSION DATE:  06/05/2022, CONSULTATION DATE:06/05/22 REFERRING MD: Dr. Modesto Charon , CHIEF COMPLAINT: Agitation    History of Present Illness:  This is a 51 yo male who presented to [facility] ER on 11/12 in police custody for psychiatric evaluation.  Per ER notes pt caused destruction at his home and expressed suicidal ideation prompting police notification.  It was also reported by the pts mother he started seeing a new doctor and was started on new medications.  However, the pts mother suspected the new medications were causing increased anxiety.  Therefore, she stopped giving the pt the medications, and the pt started hallucinating.   ED Course Upon arrival to the ER pt reported he had been having increased anxiety along with suicidal ideations with a plan to shoot himself. He requested psychiatric help for suicidal ideation  Pt evaluated by psychiatric NP who recommended psychiatric inpatient admission once pt medically cleared. Urine drug screen positive for benzos/ cannabinoid/tricyclics. While in the ER pt increasingly agitated having auditory and visual hallucinations along with paranoid behaviors. Pt required multiple doses of ativan/droperidol/geodon/ benadryl/versed.  However, he remained increasingly agitated requiring precedex gtt. PCCM team contacted for ICU admission.    Lab results: CO2 19/glucose 102/BUN 22/creatinine 1.35/ acetaminophen level <10/salicylate level <7.0/alcohol level/ <10/COVID-19 negative/Influenza negative   Initial EKG: NSR, hr 83, T wave inversion in anterior/inferior leads CT Head: Negative noncontrast Head CT.  CT Cervical Spine:  Mild motion artifact. No acute traumatic injury identified in the cervical spine. Cervical spine degeneration with mild if any spinal stenosis suspected.  Pertinent  Medical History  Anxiety  Depression   Significant Hospital Events: Including procedures,  antibiotic start and stop dates in addition to other pertinent events   11/12: Pt admitted with suicidal ideation and acute encephalopathy requiring precedex gtt   Interim History / Subjective:  As outlined above under significant events  Objective   Blood pressure (!) 133/114, pulse (!) 115, temperature 100.1 F (37.8 C), temperature source Oral, resp. rate (!) 28, height 5\' 6"  (1.676 m), weight 80 kg, SpO2 98 %.       No intake or output data in the 24 hours ending 06/05/22 13/12/23 Filed Weights   06/05/22 0026  Weight: 80 kg    Examination: General: Acutely-ill appearing male, sedated on precedex gtt HENT: Supple, no JVD  Lungs: Diminished throughout, even, non labored  Cardiovascular: Sinus tachycardia, s1s2, 2+ radial/2+ distal pulses, no edema  Abdomen: +BS x4, soft, non tender, non distended  Extremities: Normal bulk and tone Neuro: Sedated, not following commands, bilateral pupils pinpoint/sluggish   Resolved Hospital Problem list     Assessment & Plan:  Acute metabolic toxic encephalopathy suspected likely secondary to medication withdrawal  Suicidal ideation  Hx: Anxiety and Depression  - Continue precedex gtt and prn ativan for agitation/delirium  - Pt currently Involuntarily Committed  - Maintain 1:1 monitoring with bedside sitter for IVC and suicide precautions  - Psychiatry consulted appreciate input: will need psychiatric inpatient admission once medically cleared - Continue supportive care   Acute hypoxic respiratory failure likely secondary to aspiration  - Supplemental O2 for dyspnea and/or hypoxia  - Maintain O2 sats >92% - Pt HIGH RISK FOR MECHANICAL INTUBATION for airway protection   Mild acute kidney injury  Rhabdomyolysis  - Trend BMP and CK  - Replace electrolytes as indicated  - Monitor UOP - Avoid nephrotoxic medications - IV fluid resuscitation   -  Continuous telemetry monitoring   Best Practice (right click and "Reselect all SmartList  Selections" daily)   Diet/type: NPO DVT prophylaxis: LMWH GI prophylaxis: N/A Lines: N/A Foley:  N/A Code Status:  full code Last date of multidisciplinary goals of care discussion [N/A]  Labs   CBC: No results for input(s): "WBC", "NEUTROABS", "HGB", "HCT", "MCV", "PLT" in the last 168 hours.  Basic Metabolic Panel: Recent Labs  Lab 06/05/22 0034  NA 135  K 3.9  CL 102  CO2 19*  GLUCOSE 102*  BUN 22*  CREATININE 1.35*  CALCIUM 9.6   GFR: Estimated Creatinine Clearance: 64.4 mL/min (A) (by C-G formula based on SCr of 1.35 mg/dL (H)). No results for input(s): "PROCALCITON", "WBC", "LATICACIDVEN" in the last 168 hours.  Liver Function Tests: Recent Labs  Lab 06/05/22 0034  AST 53*  ALT 26  ALKPHOS 64  BILITOT 1.4*  PROT 8.9*  ALBUMIN 4.9   No results for input(s): "LIPASE", "AMYLASE" in the last 168 hours. No results for input(s): "AMMONIA" in the last 168 hours.  ABG No results found for: "PHART", "PCO2ART", "PO2ART", "HCO3", "TCO2", "ACIDBASEDEF", "O2SAT"   Coagulation Profile: No results for input(s): "INR", "PROTIME" in the last 168 hours.  Cardiac Enzymes: No results for input(s): "CKTOTAL", "CKMB", "CKMBINDEX", "TROPONINI" in the last 168 hours.  HbA1C: No results found for: "HGBA1C"  CBG: No results for input(s): "GLUCAP" in the last 168 hours.  Review of Systems:   Unable to assess pt confused   Past Medical History:  He,  has a past medical history of Anxiety and Depression.   Surgical History:   Past Surgical History:  Procedure Laterality Date   CARDIAC SURGERY       Social History:   reports that he has quit smoking. His smokeless tobacco use includes snuff. He reports current drug use. He reports that he does not drink alcohol.   Family History:  His family history includes Anxiety disorder in his brother; Bipolar disorder in his father; COPD in his father; Dementia in his father; Heart disease in his father; Hypertension in his  mother.   Allergies No Known Allergies   Home Medications  Prior to Admission medications   Medication Sig Start Date End Date Taking? Authorizing Provider  escitalopram (LEXAPRO) 5 MG tablet Take 5 mg by mouth daily. 02/14/22   [provider]  hydrOXYzine (ATARAX) 50 MG tablet Take 50 mg by mouth 3 (three) times daily. 05/31/22   [provider]  lisinopril (ZESTRIL) 10 MG tablet Take 1 tablet (10 mg total) by mouth daily. Patient not taking: Reported on 01/27/2021 12/10/20   Larae Grooms, NP  mirtazapine (REMERON) 7.5 MG tablet Take 7.5 mg by mouth at bedtime. 05/31/22   [provider]  omeprazole (PRILOSEC) 40 MG capsule Take 1 capsule (40 mg total) by mouth daily. Patient not taking: Reported on 06/05/2022 11/10/20   Larae Grooms, NP  QUEtiapine (SEROQUEL) 25 MG tablet Take 25 mg by mouth at bedtime. 05/31/22   [provider]     Critical care time: 45 minutes      Zada Girt, AGNP  Pulmonary/Critical Care Pager [phone] (please enter 7 digits) PCCM Consult Pager [phone] (please enter 7 digits)

## 2022-06-05 NOTE — ED Notes (Signed)
Pt sleeping. Precedex infusing at same rate.

## 2022-06-05 NOTE — ED Notes (Signed)
Pt continues to be agitated and attempts to walk down hall out of department. Provider aware.

## 2022-06-05 NOTE — ED Notes (Signed)
Pt continues to thrash in bed. ETCO2 placed. Security at bedside. Bed alarm on. Soft mitts placed on pt.

## 2022-06-05 NOTE — Progress Notes (Addendum)
1905 patient lethargic but awakes to voice alert to self and time can recall some incidents that brought him to the hospital no complaints of pain at this time patient has on mitts for safety and sitter at bedside 1140 patient bladder scanned 457 ml noted patient unable to urinated foley placed dark amber urine pr tolerated well

## 2022-06-05 NOTE — ED Notes (Signed)
Pt continues to exhibit paranoia and standing on bed.

## 2022-06-05 NOTE — Progress Notes (Signed)
Attempted to update pts brother Rafe Gumbs via telephone regarding pt condition, however he did not answer the telephone.  Left voicemail instructing Mr. Crotty to return my phone call.  Zada Girt, AGNP  Pulmonary/Critical Care Pager [phone] (please enter 7 digits) PCCM Consult Pager [phone] (please enter 7 digits)

## 2022-06-05 NOTE — ED Notes (Signed)
Bloodtubes in lab

## 2022-06-05 NOTE — ED Provider Notes (Signed)
7:20 AM Assumed care for off going team.   Blood pressure (!) 138/103, pulse (!) 116, temperature 100.1 F (37.8 C), temperature source Oral, resp. rate (!) 22, height 5\' 6"  (1.676 m), weight 80 kg, SpO2 95 %.  See their HPI for full report but in brief pending patient continues to have excited delirium.  Patient already been unable to obtain IV access or be on the cardiac or pulse ox monitor.  I am concerned about potential withdrawal from Xanax or alcohol given positive benzos on UDS.  Discussed with the ICU recommends trialing Precedex drip in order to get CT scan and rediscussing them after CT imaging.  7:52 AM patient given another 2 IV Ativan and then started on Precedex drip.  Continues to respond to internal stimuli  I reviewed patient's EKG from earlier where he was sinus rate of 83 without any ST elevation but did have T wave inversions in V2 to V6 aVL with QRS that was normal.  Given the positive TCAs I considered the possibility of TCA overdose as well however his QRS is not over 100 due to the need of bicarb  8:41 AM I come the patient to the CT scanner while patient was on a Precedex drip.  Patient is not bradycardia and is tolerated Precedex well.  He seems to be much more sedated and calm.  He briefly woke up with transfer in the CT scanner but then went back to sleep.  His CT head reviewed personally and interpreted is without evidence of intracranial hemorrhage.   .Critical Care  Performed by: Zada Girt, MD Authorized by: Concha Se, MD   Critical care provider statement:    Critical care time (minutes):  30   Critical care was necessary to treat or prevent imminent or life-threatening deterioration of the following conditions:  Toxidrome   Critical care was time spent personally by me on the following activities:  Development of treatment plan with patient or surrogate, discussions with consultants, evaluation of patient's response to treatment,  examination of patient, ordering and review of laboratory studies, ordering and review of radiographic studies, ordering and performing treatments and interventions, pulse oximetry, re-evaluation of patient's condition and review of old charts    Clinical impression  Agitated delirium AMS       Concha Se, MD 06/05/22 [phone]

## 2022-06-05 NOTE — Plan of Care (Signed)

## 2022-06-05 NOTE — ED Notes (Signed)
This RN returned from the restroom and staff reported that pt fell out of bed. Consulting civil engineer and provider made aware. Post-fall assessment shows no obvious injury. Pt denies pain/injury. Pt has to continually be redirected to stay in bed.

## 2022-06-05 NOTE — Progress Notes (Signed)
Spoke with Carl Lowe, the patient's mothers best friend. He was with the patient's mom and calling on her behalf (she was heard in background). Updates given, the patient's mother would like to visit when possible and expressed she does not want to disrupt him in any way, she is just a worried mother. They both stated that updates on the patient can be called to either one of them, Mr. Carl Lowe will remain with the patient's mother at this time.

## 2022-06-05 NOTE — ED Notes (Signed)
Pt continues to sleep with lights dimmed.

## 2022-06-05 NOTE — ED Notes (Signed)
Pt to CT and back with EDP. Placed on 4L oxygen as pt was desaturating to 87% on room air. Pt finally sleeping.

## 2022-06-05 NOTE — Consult Note (Signed)
[facility] Face-to-Face Psychiatry Consult   Reason for Consult:  Psych Evaluation Referring Physician:  Dr. Modesto Charon Patient Identification: Carl Lowe MRN:  [id number] Principal Diagnosis: Acute psychosis [facility]) Diagnosis:  Principal Problem:   Acute psychosis (HCC) Active Problems:   Anxiety   Total Time spent with patient: 45 minutes  Subjective:     HPI:  Psych Assessment  Carl Lowe, 51 y.o., male patient seen by TTS and this provider; chart reviewed and consulted with Dr. Modesto Charon on 06/05/22.  On evaluation Carl Lowe is actively responding to internal and external stimuli on approach.  He does not make eye contact or even acknowledge TTS or this providers presence.  He is speaking loudly, cursing and having a conversations with himself.  Patient does not engage in the assessment at all.    Per triage note, "Pt BIB [facility] in handcuffs, but is not under IVC. Pt was destroying the home per officer as to why he was place din handcuffs. Pt has seen new doctor, placed on new meds per mother on scene. New meds were causing increase in anxiety, mother stopped giving pt meds, pt started hallucinating. Mother was educated on IVC process and she would have to take out papers. Pt I no acute distress. "   Carl Lowe is laying on bed; he is alert/disoriented x 4; and mood congruent with affect.  Patient is speaking loudly, and using offensive language.  His thought process is incoherent and irrelevant; He is currently responding to internal/external stimuli and experiencing delusional thought content.  Unable to determine if patient denies suicidal/self-harm or homicidal ideation.     Recommendations:  Inpatient Psychiatric evaluation   Past Psychiatric History: Anxiety, depression  Risk to Self:   Risk to Others:   Prior Inpatient Therapy:   Prior Outpatient Therapy:    Past Medical History:  Past Medical History:  Diagnosis Date   Anxiety    Depression      Past Surgical History:  Procedure Laterality Date   CARDIAC SURGERY     Family History:  Family History  Problem Relation Age of Onset   Hypertension Mother    Bipolar disorder Father    Heart disease Father    Dementia Father    COPD Father    Anxiety disorder Brother    Family Psychiatric  History:  Social History:  Social History   Substance and Sexual Activity  Alcohol Use Never     Social History   Substance and Sexual Activity  Drug Use Yes   Comment: clonapen, xanax "any benzos" off the street, doesnt' want to do that anymore    Social History   Socioeconomic History   Marital status: Single    Spouse name: Not on file   Number of children: Not on file   Years of education: Not on file   Highest education level: Not on file  Occupational History   Not on file  Tobacco Use   Smoking status: Former   Smokeless tobacco: Current    Types: Snuff  Vaping Use   Vaping Use: Never used  Substance and Sexual Activity   Alcohol use: Never   Drug use: Yes    Comment: clonapen, xanax "any benzos" off the street, doesnt' want to do that anymore   Sexual activity: Not Currently  Other Topics Concern   Not on file  Social History Narrative   Not on file   Social Determinants of Health   Financial Resource Strain: Not on  file  Food Insecurity: Not on file  Transportation Needs: Not on file  Physical Activity: Not on file  Stress: Not on file  Social Connections: Not on file   Additional Social History:    Allergies:  No Known Allergies  Labs:  Results for orders placed or performed during the hospital encounter of 06/05/22 (from the past 48 hour(s))  Acetaminophen level     Status: Abnormal   Collection Time: 06/05/22 12:34 AM  Result Value Ref Range   Acetaminophen (Tylenol), Serum <10 (L) 10 - 30 ug/mL    Comment: (NOTE) Therapeutic concentrations vary significantly. A range of 10-30 ug/mL  may be an effective concentration for many patients.  However, some  are best treated at concentrations outside of this range. Acetaminophen concentrations >150 ug/mL at 4 hours after ingestion  and >50 ug/mL at 12 hours after ingestion are often associated with  toxic reactions.  Performed at [facility], [address] [place], [place] [address]   Comprehensive metabolic panel     Status: Abnormal   Collection Time: 06/05/22 12:34 AM  Result Value Ref Range   Sodium 135 135 - 145 mmol/L   Potassium 3.9 3.5 - 5.1 mmol/L    Comment: HEMOLYSIS AT THIS LEVEL MAY AFFECT RESULT   Chloride 102 98 - 111 mmol/L   CO2 19 (L) 22 - 32 mmol/L   Glucose, Bld 102 (H) 70 - 99 mg/dL    Comment: Glucose reference range applies only to samples taken after fasting for at least 8 hours.   BUN 22 (H) 6 - 20 mg/dL   Creatinine, Ser 1.911.35 (H) 0.61 - 1.24 mg/dL   Calcium 9.6 8.9 - 47.810.3 mg/dL   Total Protein 8.9 (H) 6.5 - 8.1 g/dL   Albumin 4.9 3.5 - 5.0 g/dL   AST 53 (H) 15 - 41 U/L    Comment: HEMOLYSIS AT THIS LEVEL MAY AFFECT RESULT   ALT 26 0 - 44 U/L    Comment: HEMOLYSIS AT THIS LEVEL MAY AFFECT RESULT   Alkaline Phosphatase 64 38 - 126 U/L   Total Bilirubin 1.4 (H) 0.3 - 1.2 mg/dL    Comment: HEMOLYSIS AT THIS LEVEL MAY AFFECT RESULT   GFR, Estimated >60 >60 mL/min    Comment: (NOTE) Calculated using the CKD-EPI Creatinine Equation (2021)    Anion gap 14 5 - 15    Comment: Performed at [facility], [address] [place], [place] [address]  Ethanol     Status: None   Collection Time: 06/05/22 12:34 AM  Result Value Ref Range   Alcohol, Ethyl (B) <10 <10 mg/dL    Comment: (NOTE) Lowest detectable limit for serum alcohol is 10 mg/dL.  For medical purposes only. Performed at [facility], [address] [place], [place] [address]   Salicylate level     Status: Abnormal   Collection Time: 06/05/22 12:34 AM  Result Value Ref Range   Salicylate Lvl <7.0 (L) 7.0 - 30.0 mg/dL    Comment: Performed at  [facility], [address] [place], [place] [address]  Resp Panel by RT-PCR (Flu A&B, Covid) Anterior Nasal Swab     Status: None   Collection Time: 06/05/22 12:47 AM   Specimen: Anterior Nasal Swab  Result Value Ref Range   SARS Coronavirus 2 by RT PCR NEGATIVE NEGATIVE    Comment: (NOTE) SARS-CoV-2 target nucleic acids are NOT DETECTED.  The SARS-CoV-2 RNA is generally detectable in upper respiratory specimens during  the acute phase of infection. The lowest concentration of SARS-CoV-2 viral copies this assay can detect is 138 copies/mL. A negative result does not preclude SARS-Cov-2 infection and should not be used as the sole basis for treatment or other patient management decisions. A negative result may occur with  improper specimen collection/handling, submission of specimen other than nasopharyngeal swab, presence of viral mutation(s) within the areas targeted by this assay, and inadequate number of viral copies(<138 copies/mL). A negative result must be combined with clinical observations, patient history, and epidemiological information. The expected result is Negative.  Fact Sheet for Patients:  BloggerCourse.com  Fact Sheet for Healthcare Providers:  SeriousBroker.it  This test is no t yet approved or cleared by the [place] FDA and  has been authorized for detection and/or diagnosis of SARS-CoV-2 by FDA under an Emergency Use Authorization (EUA). This EUA will remain  in effect (meaning this test can be used) for the duration of the COVID-19 declaration under Section 564(b)(1) of the Act, 21 U.S.C.section 360bbb-3(b)(1), unless the authorization is terminated  or revoked sooner.       Influenza A by PCR NEGATIVE NEGATIVE   Influenza B by PCR NEGATIVE NEGATIVE    Comment: (NOTE) The Xpert Xpress SARS-CoV-2/FLU/RSV plus assay is intended as an aid in the diagnosis of influenza from Nasopharyngeal  swab specimens and should not be used as a sole basis for treatment. Nasal washings and aspirates are unacceptable for Xpert Xpress SARS-CoV-2/FLU/RSV testing.  Fact Sheet for Patients: BloggerCourse.com  Fact Sheet for Healthcare Providers: SeriousBroker.it  This test is not yet approved or cleared by the [place] FDA and has been authorized for detection and/or diagnosis of SARS-CoV-2 by FDA under an Emergency Use Authorization (EUA). This EUA will remain in effect (meaning this test can be used) for the duration of the COVID-19 declaration under Section 564(b)(1) of the Act, 21 U.S.C. section 360bbb-3(b)(1), unless the authorization is terminated or revoked.  Performed at [facility], [address] [place], [place] [address]     No current facility-administered medications for this encounter.   Current Outpatient Medications  Medication Sig Dispense Refill   escitalopram (LEXAPRO) 5 MG tablet Take 5 mg by mouth daily.     hydrOXYzine (ATARAX) 50 MG tablet Take 50 mg by mouth 3 (three) times daily.     lisinopril (ZESTRIL) 10 MG tablet Take 1 tablet (10 mg total) by mouth daily. (Patient not taking: Reported on 01/27/2021) 30 tablet 0   mirtazapine (REMERON) 7.5 MG tablet Take 7.5 mg by mouth at bedtime.     omeprazole (PRILOSEC) 40 MG capsule Take 1 capsule (40 mg total) by mouth daily. (Patient not taking: Reported on 06/05/2022) 90 capsule 1   QUEtiapine (SEROQUEL) 25 MG tablet Take 25 mg by mouth at bedtime.      Musculoskeletal: Strength & Muscle Tone: within normal limits Gait & Station: normal Patient leans: N/A   Psychiatric Specialty Exam:  Presentation  General Appearance: Bizarre; Disheveled  Eye Contact:None  Speech:Clear and Coherent  Speech Volume:Increased  Handedness:Right   Mood and Affect  Mood:Anxious; Irritable; Labile  Affect:Non-Congruent; Inappropriate; Full  Range   Thought Process  Thought Processes:Disorganized; Irrevelant  Descriptions of Associations:Circumstantial  Orientation:None  Thought Content:Illogical; Scattered  History of Schizophrenia/Schizoaffective disorder:No data recorded Duration of Psychotic Symptoms:No data recorded Hallucinations:Hallucinations: Auditory  Ideas of Reference:Delusions  Suicidal Thoughts:No data recorded Homicidal Thoughts:No data recorded  Sensorium  Memory:No data recorded Judgment:Impaired  Insight:None   Executive Functions  Concentration:Poor  Attention Span:Poor  Recall:Poor  Fund of Knowledge:Poor  Language:Poor   Psychomotor Activity  Psychomotor Activity:Psychomotor Activity: Normal   Assets  Assets:Transportation; Housing; Social Support   Sleep  Sleep:Sleep: Poor   Physical Exam: Physical Exam Vitals and nursing note reviewed.  Constitutional:      Appearance: He is toxic-appearing.  HENT:     Head: Normocephalic and atraumatic.     Nose: Nose normal.  Eyes:     Pupils: Pupils are equal, round, and reactive to light.  Pulmonary:     Effort: Pulmonary effort is normal.  Musculoskeletal:        General: Normal range of motion.     Cervical back: Normal range of motion.  Skin:    General: Skin is dry.  Neurological:     Mental Status: He is alert. He is disoriented.  Psychiatric:        Attention and Perception: He is inattentive. He perceives auditory and visual hallucinations.        Mood and Affect: Affect is inappropriate.        Speech: Speech is rapid and pressured.        Behavior: Behavior is uncooperative, agitated, hyperactive and combative.        Thought Content: Thought content is delusional.        Cognition and Memory: Cognition is impaired.        Judgment: Judgment is impulsive and inappropriate.    Review of Systems  Psychiatric/Behavioral:  Positive for hallucinations. The patient is nervous/anxious.   All other systems  reviewed and are negative.  Blood pressure (!) 158/120, pulse (!) 102, temperature 100.1 F (37.8 C), temperature source Oral, resp. rate (!) 22, height 5\' 6"  (1.676 m), weight 80 kg, SpO2 96 %. Body mass index is 28.47 kg/m.  Treatment Plan Summary: Daily contact with patient to assess and evaluate symptoms and progress in treatment and Medication management  Disposition: Recommend psychiatric Inpatient admission when medically cleared. Supportive therapy provided about ongoing stressors. Refer to IOP. Discussed crisis plan, support from social network, calling 911, coming to the Emergency Department, and calling Suicide Hotline.  , NP 06/05/2022 2:03 AM

## 2022-06-05 NOTE — ED Notes (Signed)
IVC/pending psych inpatient admission when medically cleared 

## 2022-06-05 NOTE — ED Notes (Signed)
Pt blurting out random thoughts. When asked who he was talking to, he looks up at ceiling and says he is talking to his buddy. Pt is asked to refrain from profanity. He is easily redirected, but seems to be getting more agitated.

## 2022-06-05 NOTE — ED Triage Notes (Signed)
Pt BIB [facility] in handcuffs, but is not under IVC. Pt was destroying the home per officer as to why he was place din handcuffs. Pt has seen new doctor, placed on new meds per mother on scene. New meds were causing increase in anxiety, mother stopped giving pt meds, pt started hallucinating. Mother was educated on IVC process and she would have to take out papers. Pt I no acute distress.

## 2022-06-05 NOTE — ED Notes (Addendum)
Unable to get pt's vitals at this time due to extreme agitation.

## 2022-06-05 NOTE — BH Assessment (Signed)
Comprehensive Clinical Assessment (CCA) Note  06/05/2022 Carl Lowe [id number]  Chief Complaint: Patient is a 51 year old male presenting to [facility] ED under IVC. Per triage note Pt BIB [facility] in handcuffs, but is not under IVC. Pt was destroying the home per officer as to why he was place din handcuffs. Pt has seen new doctor, placed on new meds per mother on scene. New meds were causing increase in anxiety, mother stopped giving pt meds, pt started hallucinating. Mother was educated on IVC process and she would have to take out papers. Pt I no acute distress. During assessment patient appears alert but is disoriented, he is having disorganized thoughts with tangential thinking, he can be seen laying down on his bed and responding to internal stimuli, he is unable to answer any questions, he doesn't make eye contact with psyc team and is speaking loudly, making sexual remarks and cursing but not towards staff more towards the stimuli he is responding to. Unclear if patient is experiencing SI/HI.  Attempted to make contact with patient's collateral, his mother Carl Lowe [phone], but no answer and voicemail is unavailable.  Chief Complaint  Patient presents with   Psychiatric Evaluation   Visit Diagnosis: Acute Psychosis    CCA Screening, Triage and Referral (STR)  Patient Reported Information How did you hear about [place]? Legal System  Referral name: No data recorded Referral phone number: No data recorded  Whom do you see for routine medical problems? No data recorded Practice/Facility Name: No data recorded Practice/Facility Phone Number: No data recorded Name of Contact: No data recorded Contact Number: No data recorded Contact Fax Number: No data recorded Prescriber Name: No data recorded Prescriber Address (if known): No data recorded  What Is the Reason for Your Visit/Call Today? Pt BIB [facility] in handcuffs, but is not under IVC. Pt was destroying the home per officer as  to why he was place din handcuffs. Pt has seen new doctor, placed on new meds per mother on scene. New meds were causing increase in anxiety, mother stopped giving pt meds, pt started hallucinating. Mother was educated on IVC process and she would have to take out papers. Pt I no acute distress.  How Long Has This Been Causing You Problems? 1 wk - 1 month  What Do You Feel Would Help You the Most Today? No data recorded  Have You Recently Been in Any Inpatient Treatment (Hospital/Detox/Crisis Center/28-Day Program)? No data recorded Name/Location of Program/Hospital:No data recorded How Long Were You There? No data recorded When Were You Discharged? No data recorded  Have You Ever Received Services From [facility] Before? No data recorded Who Do You See at [facility]? No data recorded  Have You Recently Had Any Thoughts About Hurting Yourself? -- (UTA)  Are You Planning to Commit Suicide/Harm Yourself At This time? -- (UTA)   Have you Recently Had Thoughts About Hurting Someone Else? -- (UTA)  Explanation: No data recorded  Have You Used Any Alcohol or Drugs in the Past 24 Hours? -- (UTA)  How Long Ago Did You Use Drugs or Alcohol? No data recorded What Did You Use and How Much? No data recorded  Do You Currently Have a Therapist/Psychiatrist? -- (UTA)  Name of Therapist/Psychiatrist: No data recorded  Have You Been Recently Discharged From Any Office Practice or Programs? -- (UTA)  Explanation of Discharge From Practice/Program: No data recorded    CCA Screening Triage Referral Assessment Type of Contact: Face-to-Face  Is this Initial  or Reassessment? No data recorded Date Telepsych consult ordered in [facility]:  No data recorded Time Telepsych consult ordered in [facility]:  No data recorded  Patient Reported Information Reviewed? No data recorded Patient Left Without Being Seen? No data recorded Reason for Not Completing Assessment: No data recorded  Collateral Involvement: No  data recorded  Does Patient Have a Court Appointed Legal Guardian? No data recorded Name and Contact of Legal Guardian: No data recorded If Minor and Not Living with Parent(s), Who has Custody? No data recorded Is CPS involved or ever been involved? Never  Is APS involved or ever been involved? Never   Patient Determined To Be At Risk for Harm To Self or Others Based on Review of Patient Reported Information or Presenting Complaint? No data recorded Method: No data recorded Availability of Means: No data recorded Intent: No data recorded Notification Required: No data recorded Additional Information for Danger to Others Potential: No data recorded Additional Comments for Danger to Others Potential: No data recorded Are There Guns or Other Weapons in Your Home? -- (UTA)  Types of Guns/Weapons: No data recorded Are These Weapons Safely Secured?                            No data recorded Who Could Verify You Are Able To Have These Secured: No data recorded Do You Have any Outstanding Charges, Pending Court Dates, Parole/Probation? No data recorded Contacted To Inform of Risk of Harm To Self or Others: No data recorded  Location of Assessment: [facility] ED   Does Patient Present under Involuntary Commitment? Yes  IVC Papers Initial File Date: No data recorded  [place] of Residence: [place]   Patient Currently Receiving the Following Services: No data recorded  Determination of Need: Emergent (2 hours)   Options For Referral: No data recorded    CCA Biopsychosocial Intake/Chief Complaint:  No data recorded Current Symptoms/Problems: No data recorded  Patient Reported Schizophrenia/Schizoaffective Diagnosis in Past: No   Strengths: Unsure of the patient's strengths at this time as he is actively psychotic  Preferences: No data recorded Abilities: No data recorded  Type of Services Patient Feels are Needed: No data recorded  Initial Clinical Notes/Concerns: No data  recorded  Mental Health Symptoms Depression:   -- (UTA)   Duration of Depressive symptoms: No data recorded  Mania:   Racing thoughts   Anxiety:    Restlessness   Psychosis:   Hallucinations; Delusions; Grossly disorganized speech   Duration of Psychotic symptoms:  Less than six months   Trauma:   -- (UTA)   Obsessions:   Poor insight; Recurrent & persistent thoughts/impulses/images   Compulsions:   Absent insight/delusional; Poor Insight; Repeated behaviors/mental acts   Inattention:   -- (UTA)   Hyperactivity/Impulsivity:   -- (UTA)   Oppositional/Defiant Behaviors:   -- (UTA)   Emotional Irregularity:   Potentially harmful impulsivity; Intense/inappropriate anger   Other Mood/Personality Symptoms:  No data recorded   Mental Status Exam Appearance and self-care  Stature:   Average   Weight:   Average weight   Clothing:   Casual   Grooming:   Normal   Cosmetic use:   None   Posture/gait:   Normal   Motor activity:   Not Remarkable   Sensorium  Attention:   Confused   Concentration:   Scattered   Orientation:   -- (Patient is not oriented at this time)   Recall/memory:   Defective in  Immediate; Defective in Short-term; Defective in Recent; Defective in Remote   Affect and Mood  Affect:   Anxious; Blunted   Mood:   Anxious; Irritable; Euphoric   Relating  Eye contact:   Fleeting   Facial expression:   Anxious   Attitude toward examiner:   -- (UTA)   Thought and Language  Speech flow:  Flight of Ideas   Thought content:   Delusions   Preoccupation:   Ruminations   Hallucinations:   Visual (Unclear if patient is responding to [facility])   Organization:  No data recorded  [employer]utive Functions  Fund of Knowledge:   Fair   Intelligence:   Average   Abstraction:   -- (UTA)   Judgement:   Poor; Impaired   Reality Testing:   Distorted; Unaware   Insight:   None/zero insight; Unaware   Decision Making:    Impulsive   Social Functioning  Social Maturity:   Impulsive   Social Judgement:   Heedless   Stress  Stressors:   -- Industrial/product designer(UTA)   Coping Ability:   -- Industrial/product designer(UTA)   Skill Deficits:   -- Industrial/product designer(UTA)   Supports:   Family     Religion: Religion/Spirituality Are You A Religious Person?:  Industrial/product designer(UTA)  Leisure/Recreation: Leisure / Recreation Do You Have Hobbies?:  (UTA)  Exercise/Diet: Exercise/Diet Do You Exercise?:  (UTA) Have You Gained or Lost A Significant Amount of Weight in the Past Six Months?:  (UTA) Do You Follow a Special Diet?:  (UTA) Do You Have Any Trouble Sleeping?:  (UTA)   CCA Employment/Education Employment/Work Situation: Employment / Work Situation Employment Situation:  Industrial/product designer(UTA) Patient's Job has Been Impacted by Current Illness:  (UTA) Has Patient ever Been in the [employer]?:  Industrial/product designer(UTA)  Education: Education Is Patient Currently Attending School?:  (UTA) Did You Attend College?:  (UTA) Did You Have An Individualized Education Program (IIEP):  (UTA) Did You Have Any Difficulty At School?:  (UTA) Patient's Education Has Been Impacted by Current Illness:  (UTA)   CCA Family/Childhood History Family and Relationship History: Family history Marital status:  (UTA) Does patient have children?:  (UTA)  Childhood History:  Childhood History By whom was/is the patient raised?:  (UTA) Did patient suffer any verbal/emotional/physical/sexual abuse as a child?:  (UTA) Did patient suffer from severe childhood neglect?:  (UTA) Has patient ever been sexually abused/assaulted/raped as an adolescent or adult?:  (UTA) Was the patient ever a victim of a crime or a disaster?:  (UTA) Witnessed domestic violence?:  (UTA) Has patient been affected by domestic violence as an adult?:  Industrial/product designer(UTA)  Child/Adolescent Assessment:     CCA Substance Use Alcohol/Drug Use: Alcohol / Drug Use Pain Medications: See MAR Prescriptions: See MAR Over the Counter: See MAR History of alcohol /  drug use?:  (UTA) Longest period of sobriety (when/how long): It is unclear if the patient uses substances as patient is currently psychotic                         ASAM's:  Six Dimensions of Multidimensional Assessment  Dimension 1:  Acute Intoxication and/or Withdrawal Potential:      Dimension 2:  Biomedical Conditions and Complications:      Dimension 3:  Emotional, Behavioral, or Cognitive Conditions and Complications:     Dimension 4:  Readiness to Change:     Dimension 5:  Relapse, Continued use, or Continued Problem Potential:     Dimension 6:  Recovery/Living Environment:  ASAM Severity Score:    ASAM Recommended Level of Treatment:     Substance use Disorder (SUD)    Recommendations for Services/Supports/Treatments:    DSM5 Diagnoses: Patient Active Problem List   Diagnosis Date Noted   Acute psychosis (HCC) 06/05/2022   Hypertension 11/10/2020   Acid reflux 11/10/2020   Anxiety 11/10/2020    Patient Centered Plan: Patient is on the following Treatment Plan(s):  Impulse Control   Referrals to Alternative Service(s): Referred to Alternative Service(s):   Place:   Date:   Time:    Referred to Alternative Service(s):   Place:   Date:   Time:    Referred to Alternative Service(s):   Place:   Date:   Time:    Referred to Alternative Service(s):   Place:   Date:   Time:      @BHCOLLABOFCARE @  , LCAS-A

## 2022-06-05 NOTE — ED Notes (Signed)
Pt palced on cardiac monitor. IV placed. Pt continues to grab and thrash body.

## 2022-06-05 NOTE — ED Notes (Signed)
Dr Modesto Charon, ED provider at bedside

## 2022-06-05 NOTE — ED Notes (Addendum)
Na

## 2022-06-05 NOTE — ED Provider Notes (Addendum)
[facility] Provider Note    Event Date/Time   First MD Initiated Contact with Patient 06/05/22 0037     (approximate)   History   Psychiatric Evaluation   HPI  Carl Lowe is a 51 y.o. male   Past medical history of anxiety and depression who presents to the emergency department in police custody from his home after causing destruction to his home and expressing suicidal ideation.  He states that he has been feeling anxious, want psychiatric help for suicidal ideation with a plan to shoot himself.  He has not performed any actions of self-harm and denies ingestions, drug use or alcohol use at this time.  No other medical complaints.    History was obtained via patient I reviewed external medical notes including an urgent care visit back in July 2022 when he was anxious and requesting a prescription for Xanax.       Physical Exam   Triage Vital Signs: ED Triage Vitals  Enc Vitals Group     BP 06/05/22 0018 (!) 158/120     Pulse Rate 06/05/22 0018 (!) 102     Resp 06/05/22 0018 (!) 22     Temp 06/05/22 0018 100.1 F (37.8 C)     Temp Source 06/05/22 0018 Oral     SpO2 06/05/22 0018 96 %     Weight 06/05/22 0026 176 lb 5.9 oz (80 kg)     Height 06/05/22 0026 5\' 6"  (1.676 m)     Head Circumference --      Peak Flow --      Pain Score 06/05/22 0026 0     Pain Loc --      Pain Edu? --      Excl. in GC? --     Most recent vital signs: Vitals:   06/05/22 0018 06/05/22 0211  BP: (!) 158/120 (!) 138/103  Pulse: (!) 102 (!) 116  Resp: (!) 22 (!) 22  Temp: 100.1 F (37.8 C)   SpO2: 96% 95%    General: Awake, she is appearing mildly tachycardic 102, no tremors or fasciculations to indicate acute withdrawal.  He does not appear toxidrome neck.  He is cooperative with my exam, answering questions appropriately, moving all extremities, is alert and oriented but at times will appear distracted. CV:  Good peripheral perfusion.   Resp:  Normal effort.  Abd:  No distention.  Other:  No obvious signs of trauma on my exam.   ED Results / Procedures / Treatments   Labs (all labs ordered are listed, but only abnormal results are displayed) Labs Reviewed  ACETAMINOPHEN LEVEL - Abnormal; Notable for the following components:      Result Value   Acetaminophen (Tylenol), Serum <10 (*)    All other components within normal limits  COMPREHENSIVE METABOLIC PANEL - Abnormal; Notable for the following components:   CO2 19 (*)    Glucose, Bld 102 (*)    BUN 22 (*)    Creatinine, Ser 1.35 (*)    Total Protein 8.9 (*)    AST 53 (*)    Total Bilirubin 1.4 (*)    All other components within normal limits  SALICYLATE LEVEL - Abnormal; Notable for the following components:   Salicylate Lvl <7.0 (*)    All other components within normal limits  URINE DRUG SCREEN, QUALITATIVE ([facility] ONLY) - Abnormal; Notable for the following components:   Tricyclic, Ur Screen POSITIVE (*)    Cannabinoid 50 Ng, Ur  [place] POSITIVE (*)    Benzodiazepine, Ur Scrn POSITIVE (*)    All other components within normal limits  RESP PANEL BY RT-PCR (FLU A&B, COVID) [id number]  ETHANOL     I reviewed labs and they are notable for of salicylate and acetaminophen levels.  Creatinine is 1.35 no prior on record.  EKG  ED ECG REPORT I, Pilar Jarvis, the attending physician, personally viewed and interpreted this ECG.   Date: 06/05/2022  EKG Time: 0126  Rate: 83  Rhythm: normal sinus rhythm  Axis: nl  Intervals:none  ST&T Change: TWI lateral leads present on ECG from 2007    PROCEDURES:  Critical Care performed: No  Procedures   MEDICATIONS ORDERED IN ED: Medications  thiamine (VITAMIN B1) injection 100 mg (has no administration in time range)  folic acid injection 1 mg (has no administration in time range)  LORazepam (ATIVAN) injection 1 mg (1 mg Intramuscular Given 06/05/22 0143)  droperidol (INAPSINE) 2.5 MG/ML injection 5 mg (5 mg  Intramuscular Given 06/05/22 0143)  LORazepam (ATIVAN) injection 2 mg (2 mg Intramuscular Given 06/05/22 0244)  LORazepam (ATIVAN) injection 2 mg (2 mg Intramuscular Given 06/05/22 0341)  droperidol (INAPSINE) 2.5 MG/ML injection 5 mg (5 mg Intramuscular Given 06/05/22 0404)  ziprasidone (GEODON) injection 20 mg (20 mg Intramuscular Given 06/05/22 0548)  diphenhydrAMINE (BENADRYL) injection 50 mg (50 mg Intramuscular Given 06/05/22 0558)  midazolam (VERSED) injection 4 mg (4 mg Intramuscular Given 06/05/22 0658)    IMPRESSION / MDM / ASSESSMENT AND PLAN / ED COURSE  I reviewed the triage vital signs and the nursing notes.                              Differential diagnosis includes, but is not limited to, theatric illness, suicidal ideation, considered intoxication, organic causes will check toxicology labs, basic labs and EKG.   MDM: Patient with suicidal ideation with plan, IVC for self-harm intents.  Toxicological screening positive for TCA, benzodiazepines and cannabinoids, otherwise work-up has been unremarkable EKG shows no QRS widening.  Patient continues to become intermittently uncooperative and rising from the bed, approaching other rooms, requiring sedation with benzodiazepines and droperidol.   The patient required multiple doses of medications for sedation including multiple doses of Ativan, droperidol, Geodon, Benadryl, Versed but has remained refractory, appears to have worsened mental status and responding to external stimuli, getting out of bed frequently and had a minor fall onto his hands and knees without signs of acute traumatic injury no head strike.  Medications had been administered intramuscularly given inability to obtain IV access given his agitation.  He has maintained his airway throughout, and shows no signs of respiratory depression and instead has been increasingly agitated.  At the time of signout, I discussed with oncoming provider plans for further sedation,  plan for CT of the head to assess for intracranial bleeding, and the possibility for withdrawal or benzodiazepine withdrawal (patient stated that he quit drinking but does not elaborate on when, and has a history of benzodiazepine use and benzodiazepine in his urine toxicology) - further evaluation and management per oncoming team.   Patient's presentation is most consistent with acute presentation with potential threat to life or bodily function.       FINAL CLINICAL IMPRESSION(S) / ED DIAGNOSES   Final diagnoses:  Suicidal ideation     Rx / DC Orders   ED Discharge Orders     None  Note:  This document was prepared using Dragon voice recognition software and may include unintentional dictation errors.    Pilar Jarvis, MD 06/05/22 [id number]    Pilar Jarvis, MD 06/05/22 0400    Pilar Jarvis, MD 06/05/22 [phone]

## 2022-06-05 NOTE — ED Notes (Signed)
Wrote to pharmacy for missing folic acid.

## 2022-06-05 NOTE — ED Notes (Signed)
Advised nurse that paint has ready bed

## 2022-06-05 NOTE — ED Triage Notes (Signed)
First nurse note- Pt to triage in handcuffs with [facility] for voluntary psych eval. Has not been taking medications. Having hallucinations. Per [facility], pt destructive to home on scene to the point of requiring restraint.

## 2022-06-05 NOTE — ED Notes (Signed)
EDP at bedside  

## 2022-06-05 NOTE — ED Notes (Signed)
Pt continues to have auditory and visual hallucinations and exhibiting paranoid behaviors. Provider aware and meds ordered

## 2022-06-06 ENCOUNTER — Other Ambulatory Visit: Payer: Self-pay

## 2022-06-06 ENCOUNTER — Inpatient Hospital Stay
Admission: AD | Admit: 2022-06-06 | Discharge: 2022-06-08 | DRG: 880 | Disposition: A | Discharge status: Home / Self Care | Payer: 59 | Source: Intra-hospital | Attending: Psychiatry | Admitting: Psychiatry

## 2022-06-06 ENCOUNTER — Encounter: Payer: Self-pay | Admitting: Psychiatry

## 2022-06-06 DIAGNOSIS — K219 Gastro-esophageal reflux disease without esophagitis: Secondary | ICD-10-CM | POA: Diagnosis not present

## 2022-06-06 DIAGNOSIS — I10 Essential (primary) hypertension: Secondary | ICD-10-CM | POA: Diagnosis not present

## 2022-06-06 DIAGNOSIS — Z79899 Other long term (current) drug therapy: Secondary | ICD-10-CM | POA: Diagnosis not present

## 2022-06-06 DIAGNOSIS — Z87891 Personal history of nicotine dependence: Secondary | ICD-10-CM

## 2022-06-06 DIAGNOSIS — F419 Anxiety disorder, unspecified: Secondary | ICD-10-CM | POA: Diagnosis present

## 2022-06-06 DIAGNOSIS — F23 Brief psychotic disorder: Secondary | ICD-10-CM | POA: Diagnosis not present

## 2022-06-06 DIAGNOSIS — R69 Illness, unspecified: Secondary | ICD-10-CM | POA: Diagnosis not present

## 2022-06-06 DIAGNOSIS — R443 Hallucinations, unspecified: Principal | ICD-10-CM | POA: Diagnosis present

## 2022-06-06 DIAGNOSIS — Z818 Family history of other mental and behavioral disorders: Secondary | ICD-10-CM

## 2022-06-06 LAB — CBC WITH DIFFERENTIAL/PLATELET
Abs Immature Granulocytes: 0.02 10*3/uL (ref 0.00–0.07)
Basophils Absolute: 0 10*3/uL (ref 0.0–0.1)
Basophils Relative: 0 %
Eosinophils Absolute: 0 10*3/uL (ref 0.0–0.5)
Eosinophils Relative: 0 %
HCT: 38.8 % — ABNORMAL LOW (ref 39.0–52.0)
Hemoglobin: 13.4 g/dL (ref 13.0–17.0)
Immature Granulocytes: 0 %
Lymphocytes Relative: 30 %
Lymphs Abs: 2.7 10*3/uL (ref 0.7–4.0)
MCH: 29.2 pg (ref 26.0–34.0)
MCHC: 34.5 g/dL (ref 30.0–36.0)
MCV: 84.5 fL (ref 80.0–100.0)
Monocytes Absolute: 0.8 10*3/uL (ref 0.1–1.0)
Monocytes Relative: 9 %
Neutro Abs: 5.4 10*3/uL (ref 1.7–7.7)
Neutrophils Relative %: 61 %
Platelets: 252 10*3/uL (ref 150–400)
RBC: 4.59 MIL/uL (ref 4.22–5.81)
RDW: 12.4 % (ref 11.5–15.5)
WBC: 9.1 10*3/uL (ref 4.0–10.5)
nRBC: 0 % (ref 0.0–0.2)

## 2022-06-06 LAB — BASIC METABOLIC PANEL
Anion gap: 10 (ref 5–15)
BUN: 24 mg/dL — ABNORMAL HIGH (ref 6–20)
CO2: 23 mmol/L (ref 22–32)
Calcium: 8.9 mg/dL (ref 8.9–10.3)
Chloride: 105 mmol/L (ref 98–111)
Creatinine, Ser: 0.92 mg/dL (ref 0.61–1.24)
GFR, Estimated: >60 mL/min (ref >60)
Glucose, Bld: 107 mg/dL — ABNORMAL HIGH (ref 70–99)
Potassium: 3.8 mmol/L (ref 3.5–5.1)
Sodium: 138 mmol/L (ref 135–145)

## 2022-06-06 LAB — MAGNESIUM: Magnesium: 2.2 mg/dL (ref 1.7–2.4)

## 2022-06-06 LAB — CK: Total CK: 698 U/L — ABNORMAL HIGH (ref 49–397)

## 2022-06-06 LAB — HIV ANTIBODY (ROUTINE TESTING W REFLEX): HIV Screen 4th Generation wRfx: NONREACTIVE

## 2022-06-06 LAB — PHOSPHORUS: Phosphorus: 4.6 mg/dL (ref 2.5–4.6)

## 2022-06-06 MED ORDER — ESCITALOPRAM OXALATE 10 MG PO TABS
5.0000 mg | ORAL_TABLET | Freq: Every day | ORAL | Status: DC
  Filled 2022-06-06: qty 1

## 2022-06-06 MED ORDER — QUETIAPINE FUMARATE 25 MG PO TABS
25.0000 mg | ORAL_TABLET | Freq: Every day | ORAL | Status: DC
  Administered 2022-06-06: 25 mg via ORAL
  Filled 2022-06-06: qty 1

## 2022-06-06 MED ORDER — MIRTAZAPINE 15 MG PO TABS
7.5000 mg | ORAL_TABLET | Freq: Every day | ORAL | Status: DC
  Administered 2022-06-06: 7.5 mg via ORAL
  Filled 2022-06-06: qty 1

## 2022-06-06 MED ORDER — ALPRAZOLAM 0.5 MG PO TABS
1.0000 mg | ORAL_TABLET | Freq: Once | ORAL | Status: AC
  Administered 2022-06-06: 1 mg via ORAL
  Filled 2022-06-06: qty 2

## 2022-06-06 MED ORDER — ALUM & MAG HYDROXIDE-SIMETH 200-200-20 MG/5ML PO SUSP: 30.0000 mL | ORAL | Status: DC | PRN

## 2022-06-06 MED ORDER — ALPRAZOLAM 0.5 MG PO TABS
1.0000 mg | ORAL_TABLET | Freq: Three times a day (TID) | ORAL | Status: DC
  Administered 2022-06-06 (×2): 1 mg via ORAL
  Filled 2022-06-06 (×2): qty 2

## 2022-06-06 MED ORDER — ALPRAZOLAM 0.5 MG PO TABS
1.0000 mg | ORAL_TABLET | Freq: Three times a day (TID) | ORAL | Status: DC
  Administered 2022-06-07: 1 mg via ORAL
  Filled 2022-06-06: qty 2

## 2022-06-06 NOTE — Plan of Care (Signed)
Patient new to the unit tonight, hasn't had time to progress  Problem: Education: Goal: Knowledge of General Education information will improve Description: Including pain rating scale, medication(s)/side effects and non-pharmacologic comfort measures 06/06/2022 2235 by Elmyra Ricks, RN Outcome: Not Progressing    Problem: Health Behavior/Discharge Planning: Goal: Ability to manage health-related needs will improve 06/06/2022 2235 by Elmyra Ricks, RN Outcome: Not Progressing    Problem: Clinical Measurements: Goal: Ability to maintain clinical measurements within normal limits will improve 06/06/2022 2235 by Elmyra Ricks, RN Outcome: Not Progressing  Goal: Will remain free from infection 06/06/2022 2235 by Elmyra Ricks, RN Outcome: Not Progressing  Goal: Diagnostic test results will improve 06/06/2022 2235 by Elmyra Ricks, RN Outcome: Not Progressing  Goal: Respiratory complications will improve 06/06/2022 2235 by Elmyra Ricks, RN Outcome: Not Progressing  Goal: Cardiovascular complication will be avoided 06/06/2022 2235 by Elmyra Ricks, RN Outcome: Not Progressing 06/06/2022 2235 by Elmyra Ricks, RN Outcome: Not Progressing   Problem: Activity: Goal: Risk for activity intolerance will decrease 06/06/2022 2235 by Elmyra Ricks, RN Outcome: Not Progressing    Problem: Nutrition: Goal: Adequate nutrition will be maintained 06/06/2022 2235 by Elmyra Ricks, RN Outcome: Not Progressing  Problem: Coping: Goal: Level of anxiety will decrease 06/06/2022 2235 by Elmyra Ricks, RN Outcome: Not Progressing 06/06/2022 2235 by Elmyra Ricks, RN Outcome: Not Progressing   Problem: Elimination: Goal: Will not experience complications related to bowel motility 06/06/2022 2235 by Elmyra Ricks, RN Outcome: Not Progressing  Goal: Will not experience complications related to urinary  retention 06/06/2022 2235 by Elmyra Ricks, RN Outcome: Not Progressing    Problem: Pain Managment: Goal: General experience of comfort will improve 06/06/2022 2235 by Elmyra Ricks, RN  Problem: Safety: Goal: Ability to remain free from injury will improve 06/06/2022 2235 by Elmyra Ricks, RN Outcome: Not Progressing    Problem: Skin Integrity: Goal: Risk for impaired skin integrity will decrease 06/06/2022 2235 by Elmyra Ricks, RN Outcome: Not Progressing
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
## 2022-06-06 NOTE — TOC Initial Note (Signed)
Transition of Care [facility]) - Initial/Assessment Note    Patient Details  Name: Carl Lowe MRN: [id number] Date of Birth: 05/27/1971  [facility]) CM/SW Contact:    Allayne Butcher, RN Phone Number: 06/06/2022, 10:23 AM  Clinical Narrative:                 Current plan for inpatient psychiatric admission when medically cleared.  No TOC needs identified at this time.  Please place [facility] consult if needs arise.   Expected Discharge Plan: Psychiatric Hospital Barriers to Discharge: Continued Medical Work up   Patient Goals and CMS Choice        Expected Discharge Plan and Services Expected Discharge Plan: Psychiatric Hospital                                              Prior Living Arrangements/Services                       Activities of Daily Living      Permission Sought/Granted                  Emotional Assessment           Psych Involvement: Yes (comment)  Admission diagnosis:  Suicidal ideation [R45.851] Delirium [R41.0] Acute encephalopathy [G93.40] Altered mental status, unspecified altered mental status type [R41.82] Patient Active Problem List   Diagnosis Date Noted   Acute psychosis (HCC) 06/05/2022   Acute encephalopathy 06/05/2022   Hypertension 11/10/2020   Acid reflux 11/10/2020   Anxiety 11/10/2020   PCP:  Larae Grooms, NP Pharmacy:   [facility] - [place], [place] - [address] [place] [address] Phone: [phone] Fax: [phone]  [id number] [place], [place] - [address] AT [facility] OF [address] [place] [address] [place] [address] Phone: [phone] Fax: [phone]     Social Determinants of Health (SDOH) Interventions    Readmission Risk Interventions     No data to display

## 2022-06-06 NOTE — BH Assessment (Signed)
Patient is to be admitted to [facility] by Psychiatric Nurse Practitioner  Valetta Mole .  Attending Physician will be Dr.  Toni Amend .   Patient has been assigned to room 320, by [facility] Charge Nurse [place].   Intake Paper Work has been signed and placed on patient chart.  Staff is aware of the admission: Quentin Angst, Patient's Nurse  Rosey Bath, Patient Access.   Pt can arrive after 9 PM 06/06/2022. Number for report [phone].

## 2022-06-06 NOTE — Progress Notes (Signed)
Patient admitted from [facility] - ICU, report received from [place], [place]. Pt stated that he is here because of a recent change to his psych medication and that Lexipro that he just started made him freak out and start hallucinating. Pt skin assessment completed with Marylu Lund, RN. Pt has a scar on his left shoulder from a previous surgery. No other abnormalities found. No contraband found. Pt compliant with medication administration per MD orders. Pt given education, support, and encouragement to be active in his treatment plan. Patient oriented to the unit and his room. Pt being monitored Q 15 minutes for safety per unit protocol, remains safe on the unit.

## 2022-06-06 NOTE — Tx Team (Signed)
Initial Treatment Plan 06/06/2022 10:36 PM [name] TDV:[id number]    PATIENT STRESSORS: Marital or family conflict   Medication change or noncompliance     PATIENT STRENGTHS: Motivation for treatment/growth  Supportive family/friends    PATIENT IDENTIFIED PROBLEMS: Anxiety  Depression                   DISCHARGE CRITERIA:  Improved stabilization in mood, thinking, and/or behavior Verbal commitment to aftercare and medication compliance  PRELIMINARY DISCHARGE PLAN: Outpatient therapy Return to previous living arrangement  PATIENT/FAMILY INVOLVEMENT: This treatment plan has been presented to and reviewed with the patient, Carl Lowe. The patient has been given the opportunity to ask questions and make suggestions.  Elmyra Ricks, RN 06/06/2022, 10:36 PM

## 2022-06-06 NOTE — Progress Notes (Signed)
Patient stating no to question on precedex but alert when calling him he's alert to person place and date
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
## 2022-06-06 NOTE — Progress Notes (Signed)
1905 patient alert and orientated able to make all needs know sitter at bedside. Patient waiting to be transferred to psych floor at 9pm tonight

## 2022-06-06 NOTE — Consult Note (Cosign Needed Addendum)
[facility] Consult   Reason for Consult:  follow-up psychosis Referring Physician:  Kasa Patient Identification: Carl Lowe MRN:  [id number] Principal Diagnosis: Acute psychosis ([place]) Diagnosis:  Principal Problem:   Acute psychosis ([place]) Active Problems:   Anxiety   Acute encephalopathy   Total Time spent with patient: 30 minutes  Subjective: "I had a nervous breakdown." Carl Lowe is a 51 y.o. male patient admitted with psychosis.  HPI:  Follow-up for patient seen in the ED last evening and determined to need psychiatric hospitalization when medically cleared. Patient was admitted to ICU and put on Precedex drip.   1630: Patient has been off Precedex drip since late morning according to bedside nurse.  On evaluation this afternoon, patient is alert and oriented to self, date, president, place.  He is remembering that he was seeing "huge planes to come and get them."  He does not remember much of what happened, or exactly how he got to the hospital.  Patient reports that he has had 2 of these episodes in the past.  Patient denies alcohol or other illicit drug use.  BAL less than 10.  UDS positive for benzodiazepines and cannabinoids.  Patient reports having panic attacks, consisting of a feeling that he was going to die, symptoms of dread.  Getting jittery and out of breath.  He says he has these panic attacks about 2-3 times a week, lasting 5 minutes to 3 hours.  When questioned about his Xanax use, indicated that there is no PDMP registry for legitimate prescription, patient states that he gets his Xanax off the street.  He says he uses 1 mg 3 times a day for the last 2 years.  I asked patient if he has seen a psychiatrist, as he was referred to 1 by his primary care and Urgent Care.  Patient says that he has not been able to see anyone outpatient, but states "I need my Xanex."  Patient has not followed up with his primary care providers.  Patient says  he needs help.  He reports that his brother and father have the same thing, having "psychotic episodes" periodically.  Discussion with patient regarding benzodiazepine use and that he will most likely be weaned off Xanax during this hospitalization.  Discussed that there are other medications that can help him.  Patient denies suicidal thoughts or intent at this time.  He does not remember telling anybody that he was suicidal.  No unsafe behavior noted today.  Patient denies visual or auditory hallucinations; perceptions appear to be within normal limits at this time.  Patient reports that he is working, but "barely hangs on" because his anxiety is so bad.  Past Psychiatric History: "psychotic episodes" per patient.  He states this is the third time.  Risk to Self:   Risk to Others:   Prior Inpatient Therapy:   Prior Outpatient Therapy:    Past Medical History:  Past Medical History:  Diagnosis Date   Anxiety    Depression     Past Surgical History:  Procedure Laterality Date   CARDIAC SURGERY     Family History:  Family History  Problem Relation Age of Onset   Hypertension Mother    Bipolar disorder Father    Heart disease Father    Dementia Father    COPD Father    Anxiety disorder Brother    Family Psychiatric  History: Patient states he is father had the same thing before he passed away.  Father ended  up with dementia.  Father took Xanax for years.  Patient's and brother also has similar episodes Social History:  Social History   Substance and Sexual Activity  Alcohol Use Never     Social History   Substance and Sexual Activity  Drug Use Yes   Comment: clonapen, xanax "any benzos" off the street, doesnt' want to do that anymore    Social History   Socioeconomic History   Marital status: Single    Spouse name: Not on file   Number of children: Not on file   Years of education: Not on file   Highest education level: Not on file  Occupational History   Not on  file  Tobacco Use   Smoking status: Former   Smokeless tobacco: Current    Types: Snuff  Vaping Use   Vaping Use: Never used  Substance and Sexual Activity   Alcohol use: Never   Drug use: Yes    Comment: clonapen, xanax "any benzos" off the street, doesnt' want to do that anymore   Sexual activity: Not Currently  Other Topics Concern   Not on file  Social History Narrative   Not on file   Social Determinants of Health   Financial Resource Strain: Not on file  Food Insecurity: Not on file  Transportation Needs: Not on file  Physical Activity: Not on file  Stress: Not on file  Social Connections: Not on file   Additional Social History:    Allergies:  No Known Allergies  Labs:  Results for orders placed or performed during the hospital encounter of 06/05/22 (from the past 48 hour(s))  Acetaminophen level     Status: Abnormal   Collection Time: 06/05/22 12:34 AM  Result Value Ref Range   Acetaminophen (Tylenol), Serum <10 (L) 10 - 30 ug/mL    Comment: (NOTE) Therapeutic concentrations vary significantly. A range of 10-30 ug/mL  may be an effective concentration for many patients. However, some  are best treated at concentrations outside of this range. Acetaminophen concentrations >150 ug/mL at 4 hours after ingestion  and >50 ug/mL at 12 hours after ingestion are often associated with  toxic reactions.  Performed at [facility], [address] [place], [place] [address]   Comprehensive metabolic panel     Status: Abnormal   Collection Time: 06/05/22 12:34 AM  Result Value Ref Range   Sodium 135 135 - 145 mmol/L   Potassium 3.9 3.5 - 5.1 mmol/L    Comment: HEMOLYSIS AT THIS LEVEL MAY AFFECT RESULT   Chloride 102 98 - 111 mmol/L   CO2 19 (L) 22 - 32 mmol/L   Glucose, Bld 102 (H) 70 - 99 mg/dL    Comment: Glucose reference range applies only to samples taken after fasting for at least 8 hours.   BUN 22 (H) 6 - 20 mg/dL   Creatinine, Ser 1.35 (H) 0.61  - 1.24 mg/dL   Calcium 9.6 8.9 - 10.3 mg/dL   Total Protein 8.9 (H) 6.5 - 8.1 g/dL   Albumin 4.9 3.5 - 5.0 g/dL   AST 53 (H) 15 - 41 U/L    Comment: HEMOLYSIS AT THIS LEVEL MAY AFFECT RESULT   ALT 26 0 - 44 U/L    Comment: HEMOLYSIS AT THIS LEVEL MAY AFFECT RESULT   Alkaline Phosphatase 64 38 - 126 U/L   Total Bilirubin 1.4 (H) 0.3 - 1.2 mg/dL    Comment: HEMOLYSIS AT THIS LEVEL MAY AFFECT RESULT   GFR, Estimated >60 >60  mL/min    Comment: (NOTE) Calculated using the CKD-EPI Creatinine Equation (2021)    Anion gap 14 5 - 15    Comment: Performed at [facility], [address] [place], [place] [address]  Ethanol     Status: None   Collection Time: 06/05/22 12:34 AM  Result Value Ref Range   Alcohol, Ethyl (B) <10 <10 mg/dL    Comment: (NOTE) Lowest detectable limit for serum alcohol is 10 mg/dL.  For medical purposes only. Performed at [facility], [address] [place], [place] [id number]   Salicylate level     Status: Abnormal   Collection Time: 06/05/22 12:34 AM  Result Value Ref Range   Salicylate Lvl [id number] (L) 7.0 - 30.0 mg/dL    Comment: Performed at [facility], [address] [place], [place] [address]  CK     Status: Abnormal   Collection Time: 06/05/22 12:39 AM  Result Value Ref Range   Total CK 730 (H) 49 - 397 U/L    Comment: HEMOLYSIS AT THIS LEVEL MAY AFFECT RESULT Performed at [facility], [address] [place], [place] [address]   Resp Panel by RT-PCR (Flu A&B, Covid) Anterior Nasal Swab     Status: None   Collection Time: 06/05/22 12:47 AM   Specimen: Anterior Nasal Swab  Result Value Ref Range   SARS Coronavirus 2 by RT PCR NEGATIVE NEGATIVE    Comment: (NOTE) SARS-CoV-2 target nucleic acids are NOT DETECTED.  The SARS-CoV-2 RNA is generally detectable in upper respiratory specimens during the acute phase of infection. The lowest concentration of SARS-CoV-2 viral copies this assay can detect is 138  copies/mL. A negative result does not preclude SARS-Cov-2 infection and should not be used as the sole basis for treatment or other patient management decisions. A negative result may occur with  improper specimen collection/handling, submission of specimen other than nasopharyngeal swab, presence of viral mutation(s) within the areas targeted by this assay, and inadequate number of viral copies(<138 copies/mL). A negative result must be combined with clinical observations, patient history, and epidemiological information. The expected result is Negative.  Fact Sheet for Patients:  EntrepreneurPulse.com.au  Fact Sheet for Healthcare Providers:  IncredibleEmployment.be  This test is no t yet approved or cleared by the [place] FDA and  has been authorized for detection and/or diagnosis of SARS-CoV-2 by FDA under an Emergency Use Authorization (EUA). This EUA will remain  in effect (meaning this test can be used) for the duration of the COVID-19 declaration under Section 564(b)(1) of the Act, 21 U.S.C.section 360bbb-3(b)(1), unless the authorization is terminated  or revoked sooner.       Influenza A by PCR NEGATIVE NEGATIVE   Influenza B by PCR NEGATIVE NEGATIVE    Comment: (NOTE) The Xpert Xpress SARS-CoV-2/FLU/RSV plus assay is intended as an aid in the diagnosis of influenza from Nasopharyngeal swab specimens and should not be used as a sole basis for treatment. Nasal washings and aspirates are unacceptable for Xpert Xpress SARS-CoV-2/FLU/RSV testing.  Fact Sheet for Patients: EntrepreneurPulse.com.au  Fact Sheet for Healthcare Providers: IncredibleEmployment.be  This test is not yet approved or cleared by the [place] FDA and has been authorized for detection and/or diagnosis of SARS-CoV-2 by FDA under an Emergency Use Authorization (EUA). This EUA will remain in effect (meaning this test  can be used) for the duration of the COVID-19 declaration under Section 564(b)(1) of the Act, 21 U.S.C. section 360bbb-3(b)(1), unless the authorization is terminated  or revoked.  Performed at [facility], [address] [place], [place] [address]   Urine Drug Screen, Qualitative [facility] only)     Status: Abnormal   Collection Time: 06/05/22 12:49 AM  Result Value Ref Range   Tricyclic, Ur Screen POSITIVE (A) NONE DETECTED   Amphetamines, Ur Screen NONE DETECTED NONE DETECTED   MDMA (Ecstasy)Ur Screen NONE DETECTED NONE DETECTED   Cocaine Metabolite,Ur [place] NONE DETECTED NONE DETECTED   Opiate, Ur Screen NONE DETECTED NONE DETECTED   Phencyclidine (PCP) Ur S NONE DETECTED NONE DETECTED   Cannabinoid 50 Ng, Ur [place] POSITIVE (A) NONE DETECTED   Barbiturates, Ur Screen NONE DETECTED NONE DETECTED   Benzodiazepine, Ur Scrn POSITIVE (A) NONE DETECTED   Methadone Scn, Ur NONE DETECTED NONE DETECTED    Comment: (NOTE) Tricyclics + metabolites, urine    Cutoff 1000 ng/mL Amphetamines + metabolites, urine  Cutoff 1000 ng/mL MDMA (Ecstasy), urine              Cutoff 500 ng/mL Cocaine Metabolite, urine          Cutoff 300 ng/mL Opiate + metabolites, urine        Cutoff 300 ng/mL Phencyclidine (PCP), urine         Cutoff 25 ng/mL Cannabinoid, urine                 Cutoff 50 ng/mL Barbiturates + metabolites, urine  Cutoff 200 ng/mL Benzodiazepine, urine              Cutoff 200 ng/mL Methadone, urine                   Cutoff 300 ng/mL  The urine drug screen provides only a preliminary, unconfirmed analytical test result and should not be used for non-medical purposes. Clinical consideration and professional judgment should be applied to any positive drug screen result due to possible interfering substances. A more specific alternate chemical method must be used in order to obtain a confirmed analytical result. Gas chromatography / mass spectrometry (GC/MS) is the preferred confirm  atory method. Performed at [facility], [address] [place], [place] [address]   Procalcitonin     Status: None   Collection Time: 06/05/22  7:52 AM  Result Value Ref Range   Procalcitonin <0.10 ng/mL    Comment:        Interpretation: PCT (Procalcitonin) <= 0.5 ng/mL: Systemic infection (sepsis) is not likely. Local bacterial infection is possible. (NOTE)       Sepsis PCT Algorithm           Lower Respiratory Tract                                      Infection PCT Algorithm    ----------------------------     ----------------------------         PCT < 0.25 ng/mL                PCT < 0.10 ng/mL          Strongly encourage             Strongly discourage   discontinuation of antibiotics    initiation of antibiotics    ----------------------------     -----------------------------       PCT 0.25 - 0.50 ng/mL            PCT 0.10 - 0.25  ng/mL               OR       >80% decrease in PCT            Discourage initiation of                                            antibiotics      Encourage discontinuation           of antibiotics    ----------------------------     -----------------------------         PCT >= 0.50 ng/mL              PCT 0.26 - 0.50 ng/mL               AND        <80% decrease in PCT             Encourage initiation of                                             antibiotics       Encourage continuation           of antibiotics    ----------------------------     -----------------------------        PCT >= 0.50 ng/mL                  PCT > 0.50 ng/mL               AND         increase in PCT                  Strongly encourage                                      initiation of antibiotics    Strongly encourage escalation           of antibiotics                                     -----------------------------                                           PCT <= 0.25 ng/mL                                                 OR                                         > 80% decrease in PCT  Discontinue / Do not initiate                                             antibiotics  Performed at [facility], [address] [place], [place] [address]   Magnesium     Status: None   Collection Time: 06/05/22  7:52 AM  Result Value Ref Range   Magnesium 2.3 1.7 - 2.4 mg/dL    Comment: Performed at [facility], [address] [place], [place] [address]  Phosphorus     Status: Abnormal   Collection Time: 06/05/22  7:52 AM  Result Value Ref Range   Phosphorus 4.7 (H) 2.5 - 4.6 mg/dL    Comment: Performed at [facility], [address] [place], [place] [address]  MRSA Next Gen by PCR, Nasal     Status: None   Collection Time: 06/05/22  1:00 PM   Specimen: Nasal Mucosa; Nasal Swab  Result Value Ref Range   MRSA by PCR Next Gen NOT DETECTED NOT DETECTED    Comment: (NOTE) The GeneXpert MRSA Assay (FDA approved for NASAL specimens only), is one component of a comprehensive MRSA colonization surveillance program. It is not intended to diagnose MRSA infection nor to guide or monitor treatment for MRSA infections. Test performance is not FDA approved in patients less than 44 years old. Performed at [facility], [address] [place], [place] [address]   Glucose, capillary     Status: None   Collection Time: 06/05/22  1:31 PM  Result Value Ref Range   Glucose-Capillary 84 70 - 99 mg/dL    Comment: Glucose reference range applies only to samples taken after fasting for at least 8 hours.  Blood gas, venous     Status: Abnormal   Collection Time: 06/05/22  3:56 PM  Result Value Ref Range   pH, Ven 7.43 7.25 - 7.43   pCO2, Ven 42 (L) 44 - 60 mmHg   pO2, Ven 69 (H) 32 - 45 mmHg   Bicarbonate 27.9 20.0 - 28.0 mmol/L   Acid-Base Excess 3.2 (H) 0.0 - 2.0 mmol/L   O2 Saturation 95.6 %   Patient temperature 37.0    Collection site VENOUS     Comment: Performed at  [facility], [address] [place], [place] [address]  HIV Antibody (routine testing w rflx)     Status: None   Collection Time: 06/06/22  4:22 AM  Result Value Ref Range   HIV Screen 4th Generation wRfx Non Reactive Non Reactive    Comment: Performed at [facility] [address], [place], [place] [address]  Magnesium     Status: None   Collection Time: 06/06/22  4:22 AM  Result Value Ref Range   Magnesium 2.2 1.7 - 2.4 mg/dL    Comment: Performed at [facility], [address] [place], [place] [address]  Phosphorus     Status: None   Collection Time: 06/06/22  4:22 AM  Result Value Ref Range   Phosphorus 4.6 2.5 - 4.6 mg/dL    Comment: Performed at [facility], [address] [place], [place] [address]  CBC with Differential/Platelet     Status: Abnormal   Collection Time: 06/06/22  4:22 AM  Result Value Ref Range   WBC 9.1 4.0 - 10.5 K/uL   RBC 4.59 4.22 -  5.81 MIL/uL   Hemoglobin 13.4 13.0 - 17.0 g/dL   HCT 00.1 (L) 74.9 - 44.9 %   MCV 84.5 80.0 - 100.0 fL   MCH 29.2 26.0 - 34.0 pg   MCHC 34.5 30.0 - 36.0 g/dL   RDW 67.5 91.6 - 38.4 %   Platelets 252 150 - 400 K/uL   nRBC 0.0 0.0 - 0.2 %   Neutrophils Relative % 61 %   Neutro Abs 5.4 1.7 - 7.7 K/uL   Lymphocytes Relative 30 %   Lymphs Abs 2.7 0.7 - 4.0 K/uL   Monocytes Relative 9 %   Monocytes Absolute 0.8 0.1 - 1.0 K/uL   Eosinophils Relative 0 %   Eosinophils Absolute 0.0 0.0 - 0.5 K/uL   Basophils Relative 0 %   Basophils Absolute 0.0 0.0 - 0.1 K/uL   Immature Granulocytes 0 %   Abs Immature Granulocytes 0.02 0.00 - 0.07 K/uL    Comment: Performed at [facility], [address] [place], [place] [address]  Basic metabolic panel     Status: Abnormal   Collection Time: 06/06/22  4:22 AM  Result Value Ref Range   Sodium 138 135 - 145 mmol/L   Potassium 3.8 3.5 - 5.1 mmol/L   Chloride 105 98 - 111 mmol/L   CO2 23 22 - 32 mmol/L   Glucose, Bld 107 (H) 70 - 99  mg/dL    Comment: Glucose reference range applies only to samples taken after fasting for at least 8 hours.   BUN 24 (H) 6 - 20 mg/dL   Creatinine, Ser 3.57 0.61 - 1.24 mg/dL   Calcium 8.9 8.9 - 01.7 mg/dL   GFR, Estimated >79 >39 mL/min    Comment: (NOTE) Calculated using the CKD-EPI Creatinine Equation (2021)    Anion gap 10 5 - 15    Comment: Performed at [facility], [address] [place], [place] [address]  CK     Status: Abnormal   Collection Time: 06/06/22  4:22 AM  Result Value Ref Range   Total CK 698 (H) 49 - 397 U/L    Comment: Performed at [facility], [address] [place], [place] [address]    Current Facility-Administered Medications  Medication Dose Route Frequency Provider Last Rate Last Admin   acetaminophen (TYLENOL) tablet 650 mg  650 mg Oral Q4H PRN Ezequiel Essex, NP       ALPRAZolam Prudy Feeler) tablet 1 mg  1 mg Oral TID Erin Fulling, MD   1 mg at 06/06/22 1605   Chlorhexidine Gluconate Cloth 2 % PADS 6 each  6 each Topical Q0600 Vida Rigger, MD   6 each at 06/06/22 1041   dexmedetomidine (PRECEDEX) 400 MCG/100ML (4 mcg/mL) infusion  0.4-1.2 mcg/kg/hr Intravenous Titrated Concha Se, MD   Stopped at 06/06/22 1347   docusate sodium (COLACE) capsule 100 mg  100 mg Oral BID PRN Ezequiel Essex, NP       enoxaparin (LOVENOX) injection 40 mg  40 mg Subcutaneous Q24H Ezequiel Essex, NP   40 mg at 06/05/22 2133   folic acid injection 1 mg  1 mg Intravenous Daily Concha Se, MD   1 mg at 06/06/22 1041   LORazepam (ATIVAN) injection 4 mg  4 mg Intravenous Once PRN Concha Se, MD       ondansetron [facility]) injection 4 mg  4 mg Intravenous Q6H PRN Ezequiel Essex, NP       polyethylene glycol (MIRALAX /  GLYCOLAX) packet 17 g  17 g Oral Daily PRN Teressa Lower, NP        Musculoskeletal: Strength & Muscle Tone:  In ICU, sitting up in bed, Gait & Station:  Did not observe Patient leans: N/A    Psychiatric Specialty  Exam:  Presentation  General Appearance:  Appropriate for Environment  Eye Contact: Good  Speech: Clear and Coherent  Speech Volume: Normal  Handedness: Right   Mood and Affect  Mood: Anxious  Affect: Congruent   Thought Process  Thought Processes: Coherent; Goal Directed  Descriptions of Associations:Intact  Orientation:Full (Time, Place and Person)  Thought Content:WDL  History of Schizophrenia/Schizoaffective disorder:No  Duration of Psychotic Symptoms:Less than six months  Hallucinations:Hallucinations: None  Ideas of Reference:None  Suicidal Thoughts:Suicidal Thoughts: No  Homicidal Thoughts:Homicidal Thoughts: No   Sensorium  Memory:Immediate Good; Recent Fair  Judgment: Poor  Insight: Fair   Community education officer  Concentration: Good  Attention Span: Good  Recall: [employer] of Knowledge: Fair  Language: Fair   Psychomotor Activity  Psychomotor Activity: Psychomotor Activity: Normal   Assets  Assets: Financial Resources/Insurance; Housing; Desire for Improvement; Physical Health   Sleep  Sleep: Sleep: Fair   Physical Exam: Physical Exam Vitals and nursing note reviewed.  HENT:     Head: Normocephalic.     Nose: No congestion or rhinorrhea.  Eyes:     General:        Right eye: No discharge.        Left eye: No discharge.  Cardiovascular:     Rate and Rhythm: Normal rate.  Pulmonary:     Effort: Pulmonary effort is normal.  Musculoskeletal:        General: Normal range of motion.  Skin:    General: Skin is dry.  Neurological:     Mental Status: He is alert and oriented to person, place, and time.  Psychiatric:        Attention and Perception: Attention normal.        Mood and Affect: Mood is anxious.        Speech: Speech normal.        Behavior: Behavior normal. Behavior is cooperative.        Thought Content: Thought content normal.        Cognition and Memory: Cognition normal.    Review of  Systems  Constitutional: Negative.   Eyes: Negative.   Respiratory: Negative.    Musculoskeletal: Negative.   Skin: Negative.   Psychiatric/Behavioral:  Positive for depression and substance abuse (Xanex). Negative for hallucinations (Denies currently), memory loss and suicidal ideas. The patient is nervous/anxious. The patient does not have insomnia.    Blood pressure 121/66, pulse 62, temperature 97.7 F (36.5 C), temperature source Oral, resp. rate 18, height 5\' 6"  (1.676 m), weight 80 kg, SpO2 95 %. Body mass index is 28.47 kg/m.  Treatment Plan Summary: Daily contact with patient to assess and evaluate symptoms and progress in treatment and Plan admit to inpatient psych when medically cleared.  Reviewed with Dr. Mortimer Fries and Dr. Weber Cooks via secure chat  Disposition: Recommend psychiatric Inpatient admission when medically cleared.  Sherlon Handing, NP 06/06/2022 5:10 PM
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
## 2022-06-07 DIAGNOSIS — R443 Hallucinations, unspecified: Secondary | ICD-10-CM | POA: Diagnosis not present

## 2022-06-07 LAB — CBC WITH DIFFERENTIAL/PLATELET
Abs Immature Granulocytes: 0.02 10*3/uL (ref 0.00–0.07)
Basophils Absolute: 0 10*3/uL (ref 0.0–0.1)
Basophils Relative: 0 %
Eosinophils Absolute: 0.1 10*3/uL (ref 0.0–0.5)
Eosinophils Relative: 1 %
HCT: 41.4 % (ref 39.0–52.0)
Hemoglobin: 13.9 g/dL (ref 13.0–17.0)
Immature Granulocytes: 0 %
Lymphocytes Relative: 34 %
Lymphs Abs: 3 10*3/uL (ref 0.7–4.0)
MCH: 28.1 pg (ref 26.0–34.0)
MCHC: 33.6 g/dL (ref 30.0–36.0)
MCV: 83.8 fL (ref 80.0–100.0)
Monocytes Absolute: 0.8 10*3/uL (ref 0.1–1.0)
Monocytes Relative: 10 %
Neutro Abs: 4.9 10*3/uL (ref 1.7–7.7)
Neutrophils Relative %: 55 %
Platelets: 288 10*3/uL (ref 150–400)
RBC: 4.94 MIL/uL (ref 4.22–5.81)
RDW: 12.5 % (ref 11.5–15.5)
WBC: 8.8 10*3/uL (ref 4.0–10.5)
nRBC: 0 % (ref 0.0–0.2)

## 2022-06-07 LAB — BASIC METABOLIC PANEL
Anion gap: 8 (ref 5–15)
BUN: 16 mg/dL (ref 6–20)
CO2: 28 mmol/L (ref 22–32)
Calcium: 9.1 mg/dL (ref 8.9–10.3)
Chloride: 105 mmol/L (ref 98–111)
Creatinine, Ser: 0.88 mg/dL (ref 0.61–1.24)
GFR, Estimated: >60 mL/min (ref >60)
Glucose, Bld: 100 mg/dL — ABNORMAL HIGH (ref 70–99)
Potassium: 3.6 mmol/L (ref 3.5–5.1)
Sodium: 141 mmol/L (ref 135–145)

## 2022-06-07 LAB — MAGNESIUM: Magnesium: 2.1 mg/dL (ref 1.7–2.4)

## 2022-06-07 LAB — PHOSPHORUS: Phosphorus: 2.3 mg/dL — ABNORMAL LOW (ref 2.5–4.6)

## 2022-06-07 MED ORDER — CLONAZEPAM 1 MG PO TABS
1.0000 mg | ORAL_TABLET | Freq: Two times a day (BID) | ORAL | Status: DC
  Administered 2022-06-07 – 2022-06-08 (×2): 1 mg via ORAL
  Filled 2022-06-07 (×2): qty 1

## 2022-06-07 MED ORDER — HYDROCHLOROTHIAZIDE 25 MG PO TABS
25.0000 mg | ORAL_TABLET | Freq: Every day | ORAL | Status: DC
  Administered 2022-06-07 – 2022-06-08 (×2): 25 mg via ORAL
  Filled 2022-06-07 (×2): qty 1

## 2022-06-07 MED ORDER — QUETIAPINE FUMARATE 25 MG PO TABS
25.0000 mg | ORAL_TABLET | Freq: Once | ORAL | Status: AC
  Administered 2022-06-07: 25 mg via ORAL
  Filled 2022-06-07: qty 1
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
## 2022-06-07 NOTE — [facility] Suicide Risk Assessment (Signed)
[facility] Admission Suicide Risk Assessment   Nursing information obtained from:  Patient Demographic factors:  Male Current Mental Status:  NA Loss Factors:  NA Historical Factors:  NA Risk Reduction Factors:  NA  Total Time spent with patient: 45 minutes Principal Problem: Hallucinations Diagnosis:  Principal Problem:   Hallucinations Active Problems:   Hypertension   Anxiety  Subjective Data: Patient seen and chart reviewed.  Patient transferred from the ICU where he had been briefly stabilized.  He was in the ICU because he required such high level of sedation to cope with his initial agitated presentation.  Today the patient is not agitated and was cooperative with the interview.  Has only vague memories of what brought him to the hospital.  Blames it on some recent medication he had been taking.  Denies any suicidal or homicidal thought now denies any hallucinations now.  Not behaving in an aggressive manner.  No evidence psychosis.  Continued Clinical Symptoms:  Alcohol Use Disorder Identification Test Final Score (AUDIT): 0 The "Alcohol Use Disorders Identification Test", Guidelines for Use in Primary Care, Second Edition.  World Science writer [facility]). Score between 0-7:  no or low risk or alcohol related problems. Score between 8-15:  moderate risk of alcohol related problems. Score between 16-19:  high risk of alcohol related problems. Score 20 or above:  warrants further diagnostic evaluation for alcohol dependence and treatment.   CLINICAL FACTORS:   Severe Anxiety and/or Agitation Alcohol/Substance Abuse/Dependencies   Musculoskeletal: Strength & Muscle Tone: within normal limits Gait & Station: normal Patient leans: N/A  Psychiatric Specialty Exam:  Presentation  General Appearance:  Appropriate for Environment  Eye Contact: Good  Speech: Clear and Coherent  Speech Volume: Normal  Handedness: Right   Mood and Affect   Mood: Anxious  Affect: Congruent   Thought Process  Thought Processes: Coherent; Goal Directed  Descriptions of Associations:Intact  Orientation:Full (Time, Place and Person)  Thought Content:WDL  History of Schizophrenia/Schizoaffective disorder:No  Duration of Psychotic Symptoms:Less than six months  Hallucinations:Hallucinations: None  Ideas of Reference:None  Suicidal Thoughts:Suicidal Thoughts: No  Homicidal Thoughts:Homicidal Thoughts: No   Sensorium  Memory: Immediate Good; Recent Fair  Judgment: Poor  Insight: Fair   Art therapist  Concentration: Good  Attention Span: Good  Recall: Fair  Fund of Knowledge: Fair  Language: Fair   Psychomotor Activity  Psychomotor Activity: Psychomotor Activity: Normal   Assets  Assets: Financial Resources/Insurance; Housing; Desire for Improvement; Physical Health   Sleep  Sleep: Sleep: Fair    Physical Exam: Physical Exam Vitals and nursing note reviewed.  Constitutional:      Appearance: Normal appearance.  HENT:     Head: Normocephalic and atraumatic.     Mouth/Throat:     Pharynx: Oropharynx is clear.  Eyes:     Pupils: Pupils are equal, round, and reactive to light.  Cardiovascular:     Rate and Rhythm: Normal rate and regular rhythm.  Pulmonary:     Effort: Pulmonary effort is normal.     Breath sounds: Normal breath sounds.  Abdominal:     General: Abdomen is flat.     Palpations: Abdomen is soft.  Musculoskeletal:        General: Normal range of motion.  Skin:    General: Skin is warm and dry.  Neurological:     General: No focal deficit present.     Mental Status: He is alert. Mental status is at baseline.  Psychiatric:  Attention and Perception: Attention normal.        Mood and Affect: Mood is anxious.        Speech: Speech normal.        Behavior: Behavior is cooperative.        Thought Content: Thought content normal.        Cognition and  Memory: Memory is impaired.        Judgment: Judgment normal.    Review of Systems  Constitutional: Negative.   HENT: Negative.    Eyes: Negative.   Respiratory: Negative.    Cardiovascular: Negative.   Gastrointestinal: Negative.   Musculoskeletal: Negative.   Skin: Negative.   Neurological: Negative.   Psychiatric/Behavioral:  Positive for hallucinations, memory loss and substance abuse. Negative for depression and suicidal ideas. The patient is nervous/anxious and has insomnia.    Blood pressure (!) 160/96, pulse 90, temperature 97.7 F (36.5 C), temperature source Oral, resp. rate 18, height 5\' 7"  (1.702 m), weight 76.7 kg, SpO2 100 %. Body mass index is 26.47 kg/m.   COGNITIVE FEATURES THAT CONTRIBUTE TO RISK:  Polarized thinking    SUICIDE RISK:   Minimal: No identifiable suicidal ideation.  Patients presenting with no risk factors but with morbid ruminations; may be classified as minimal risk based on the severity of the depressive symptoms  PLAN OF CARE: Continue 15-minute checks.  Full treatment team evaluation.  Review of history and data.  Treatment as deemed appropriate.  Ongoing assessment of dangerousness prior to discharge  I certify that inpatient services furnished can reasonably be expected to improve the patient's condition.   , MD 06/07/2022, 10:46 AM

## 2022-06-07 NOTE — Progress Notes (Signed)
Recreation Therapy Notes    Date: 06/07/2022  Time: 10:15 am     Location: Craft room   Behavioral response: N/A   Intervention Topic: Coping Skills    Discussion/Intervention: Patient refused to attend group.   Clinical Observations/Feedback:  Patient refused to attend group.    Onie Hayashi LRT/CTRS        Pegge Cumberledge 06/07/2022 12:19 PM

## 2022-06-07 NOTE — Group Note (Signed)
[facility] LCSW Group Therapy Note   Group Date: 06/07/2022 Start Time: 1300 End Time: 1400  Type of Therapy/Topic:  Group Therapy:  Feelings about Diagnosis  Participation Level:  Did Not Attend   Description of Group:    This group will allow patients to explore their thoughts and feelings about diagnoses they have received. Patients will be guided to explore their level of understanding and acceptance of these diagnoses. Facilitator will encourage patients to process their thoughts and feelings about the reactions of others to their diagnosis, and will guide patients in identifying ways to discuss their diagnosis with significant others in their lives. This group will be process-oriented, with patients participating in exploration of their own experiences as well as giving and receiving support and challenge from other group members.   Therapeutic Goals: 1. Patient will demonstrate understanding of diagnosis as evidence by identifying two or more symptoms of the disorder:  2. Patient will be able to express two feelings regarding the diagnosis 3. Patient will demonstrate ability to communicate their needs through discussion and/or role plays  Summary of Patient Progress: Patient declined to attend group, despite being invited.   Therapeutic Modalities:   Cognitive Behavioral Therapy Brief Therapy Feelings Identification    Harden Mo, LCSW

## 2022-06-07 NOTE — BHH Counselor (Signed)
CSW met with the patient to complete PSA.  CSW discussed with the patient plans to continue with his current mental health provider.  Patient reported that he plans on stopping seeing current provider because "I want Xanax and he doesn't want to prescribe it for me."  CSW dicussed with the patient that are similar medications for Xanax but do not have the addictive quality and that this has been the preferred method for some physicians.  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  CSW discussed with the patient rehab treatment, patient declined stating, "I am content with it.  It's what I've been doing for the past 5 years."  CSW discussed inpatient and outpatient treatment for SU and patient again declined.   Patient stated "I am going to find my Xanax. It's what I know.  What I am used to.  My dad did it for 35 years.  I have plenty of contracts, I can get it.  I am going to have my Xanax, just give me what I need."  CSW again recommended that patient pursue SU treatment.  Patient again declined.  Assunta Curtis, MSW, LCSW 06/07/2022 10:34 AM

## 2022-06-07 NOTE — Plan of Care (Signed)
D- Patient alert and oriented. Patient presented in a pleasant mood on assessment reporting that he slept good last night and had no complaints to voice to this Clinical research associate. Patient endorsed hopelessness, depression, and anxiety, stating that "it just comes on and goes, I don't know where it comes from, until I get my meds straightened out". Patient denies SI, HI, AVH, and pain at this time. Per his self-inventory, patient's goal for today is "getting my meds right", in which he will "be patient", in order to achieve his goal.  A- Scheduled medications administered to patient, per MD orders. Support and encouragement provided.  Routine safety checks conducted every 15 minutes.  Patient informed to notify staff with problems or concerns.  R- No adverse drug reactions noted. Patient contracts for safety at this time. Patient compliant with medications and treatment plan. Patient receptive, calm, and cooperative. Patient isolates to room, except for meals and medication. Patient remains safe at this time.  Problem: Education: Goal: Knowledge of General Education information will improve Description: Including pain rating scale, medication(s)/side effects and non-pharmacologic comfort measures Outcome: Progressing   Problem: Health Behavior/Discharge Planning: Goal: Ability to manage health-related needs will improve Outcome: Progressing   Problem: Clinical Measurements: Goal: Ability to maintain clinical measurements within normal limits will improve Outcome: Progressing Goal: Will remain free from infection Outcome: Progressing Goal: Diagnostic test results will improve Outcome: Progressing Goal: Respiratory complications will improve Outcome: Progressing Goal: Cardiovascular complication will be avoided Outcome: Progressing   Problem: Activity: Goal: Risk for activity intolerance will decrease Outcome: Progressing   Problem: Nutrition: Goal: Adequate nutrition will be maintained Outcome:  Progressing   Problem: Coping: Goal: Level of anxiety will decrease Outcome: Progressing   Problem: Elimination: Goal: Will not experience complications related to bowel motility Outcome: Progressing Goal: Will not experience complications related to urinary retention Outcome: Progressing   Problem: Pain Managment: Goal: General experience of comfort will improve Outcome: Progressing   Problem: Safety: Goal: Ability to remain free from injury will improve Outcome: Progressing   Problem: Skin Integrity: Goal: Risk for impaired skin integrity will decrease Outcome: Progressing

## 2022-06-08 DIAGNOSIS — R443 Hallucinations, unspecified: Secondary | ICD-10-CM | POA: Diagnosis not present

## 2022-06-08 LAB — CBC WITH DIFFERENTIAL/PLATELET
Abs Immature Granulocytes: 0.02 10*3/uL (ref 0.00–0.07)
Basophils Absolute: 0.1 10*3/uL (ref 0.0–0.1)
Basophils Relative: 1 %
Eosinophils Absolute: 0.1 10*3/uL (ref 0.0–0.5)
Eosinophils Relative: 1 %
HCT: 44.9 % (ref 39.0–52.0)
Hemoglobin: 15.4 g/dL (ref 13.0–17.0)
Immature Granulocytes: 0 %
Lymphocytes Relative: 47 %
Lymphs Abs: 4.4 10*3/uL — ABNORMAL HIGH (ref 0.7–4.0)
MCH: 28.6 pg (ref 26.0–34.0)
MCHC: 34.3 g/dL (ref 30.0–36.0)
MCV: 83.5 fL (ref 80.0–100.0)
Monocytes Absolute: 1 10*3/uL (ref 0.1–1.0)
Monocytes Relative: 10 %
Neutro Abs: 3.9 10*3/uL (ref 1.7–7.7)
Neutrophils Relative %: 41 %
Platelets: 319 10*3/uL (ref 150–400)
RBC: 5.38 MIL/uL (ref 4.22–5.81)
RDW: 12.6 % (ref 11.5–15.5)
WBC: 9.4 10*3/uL (ref 4.0–10.5)
nRBC: 0 % (ref 0.0–0.2)

## 2022-06-08 LAB — PHOSPHORUS: Phosphorus: 3.5 mg/dL (ref 2.5–4.6)

## 2022-06-08 LAB — BASIC METABOLIC PANEL
Anion gap: 12 (ref 5–15)
BUN: 12 mg/dL (ref 6–20)
CO2: 31 mmol/L (ref 22–32)
Calcium: 9.6 mg/dL (ref 8.9–10.3)
Chloride: 99 mmol/L (ref 98–111)
Creatinine, Ser: 0.93 mg/dL (ref 0.61–1.24)
GFR, Estimated: >60 mL/min (ref >60)
Glucose, Bld: 106 mg/dL — ABNORMAL HIGH (ref 70–99)
Potassium: 3.1 mmol/L — ABNORMAL LOW (ref 3.5–5.1)
Sodium: 142 mmol/L (ref 135–145)

## 2022-06-08 LAB — MAGNESIUM: Magnesium: 2.1 mg/dL (ref 1.7–2.4)

## 2022-06-08 MED ORDER — CLONAZEPAM 1 MG PO TABS: 1.0000 mg | ORAL_TABLET | Freq: Two times a day (BID) | ORAL | 0 refills | Status: DC

## 2022-06-08 MED ORDER — PANTOPRAZOLE SODIUM 40 MG PO TBEC
40.0000 mg | DELAYED_RELEASE_TABLET | Freq: Every day | ORAL | Status: DC
  Administered 2022-06-08: 40 mg via ORAL
  Filled 2022-06-08: qty 1

## 2022-06-08 MED ORDER — PANTOPRAZOLE SODIUM 40 MG PO TBEC: 40.0000 mg | DELAYED_RELEASE_TABLET | Freq: Every day | ORAL | 1 refills | Status: DC

## 2022-06-08 MED ORDER — HYDROCHLOROTHIAZIDE 25 MG PO TABS: 25.0000 mg | ORAL_TABLET | Freq: Every day | ORAL | 1 refills | Status: DC

## 2022-06-08 NOTE — Progress Notes (Signed)
Recreation Therapy Notes  Date: 06/08/2022  Time: 10:45 am     Location: Craft room   Behavioral response: N/A   Intervention Topic: Self-esteem     Discussion/Intervention: Patient refused to attend group.   Clinical Observations/Feedback:  Patient refused to attend group.    Orian Amberg LRT/CTRS        Dariane Natzke 06/08/2022 1:04 PM

## 2022-06-08 NOTE — Plan of Care (Signed)
D- Patient alert and oriented. Patient presents in an anxious, but pleasant mood on assessment reporting that he slept fair last night and had no complaints to voice to this Clinical research associate. Patient endorsed hopelessness, depression, and anxiety on his self-inventory stating that he has been "dealing with this all my life". Patient denies SI, HI, AVH, and pain at this time, stating "not this morning". Per his self-inventory, patient's goal for today is "getting my meds right", in which he will "stay here", in order to achieve his goal.  A- Scheduled medications administered to patient, per MD orders. Support and encouragement provided.  Routine safety checks conducted every 15 minutes.  Patient informed to notify staff with problems or concerns.  R- No adverse drug reactions noted. Patient contracts for safety at this time. Patient compliant with medications and treatment plan. Patient receptive, calm, and cooperative. Patient interacts well with others on the unit.  Patient remains safe at this time.
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
## 2022-06-08 NOTE — [facility] Suicide Risk Assessment (Signed)
[facility] INPATIENT:  Family/Significant Other Suicide Prevention Education  Suicide Prevention Education:  Patient Refusal for Family/Significant Other Suicide Prevention Education: The patient Carl Lowe has refused to provide written consent for family/significant other to be provided Family/Significant Other Suicide Prevention Education during admission and/or prior to discharge.  Physician notified.   SPE completed with pt, as pt refused to consent to family contact. SPI pamphlet provided to pt and pt was encouraged to share information with support network, ask questions, and talk about any concerns relating to SPE. Pt denies access to guns/firearms and verbalized understanding of information provided. Mobile Crisis information also provided to pt.    Harden Mo 06/08/2022, 9:18 AM

## 2022-06-08 NOTE — BHH Suicide Risk Assessment (Signed)
[facility] Discharge Suicide Risk Assessment   Principal Problem: Hallucinations Discharge Diagnoses: Principal Problem:   Hallucinations Active Problems:   Hypertension   Anxiety   Total Time spent with patient: 30 minutes  Musculoskeletal: Strength & Muscle Tone: within normal limits Gait & Station: normal Patient leans: N/A  Psychiatric Specialty Exam  Presentation  General Appearance:  Appropriate for Environment  Eye Contact: Good  Speech: Clear and Coherent  Speech Volume: Normal  Handedness: Right   Mood and Affect  Mood: Anxious  Duration of Depression Symptoms: No data recorded Affect: Congruent   Thought Process  Thought Processes: Coherent; Goal Directed  Descriptions of Associations:Intact  Orientation:Full (Time, Place and Person)  Thought Content:WDL  History of Schizophrenia/Schizoaffective disorder:No  Duration of Psychotic Symptoms:Less than six months  Hallucinations:No data recorded Ideas of Reference:None  Suicidal Thoughts:No data recorded Homicidal Thoughts:No data recorded  Sensorium  Memory: Immediate Good; Recent Fair  Judgment: Poor  Insight: Fair   Art therapist  Concentration: Good  Attention Span: Good  Recall: [employer] of Knowledge: Fair  Language: Fair   Psychomotor Activity  Psychomotor Activity:No data recorded  Assets  Assets: Financial Resources/Insurance; Housing; Desire for Improvement; Physical Health   Sleep  Sleep:No data recorded  Physical Exam: Physical Exam Vitals and nursing note reviewed.  Constitutional:      Appearance: Normal appearance.  HENT:     Head: Normocephalic and atraumatic.     Mouth/Throat:     Pharynx: Oropharynx is clear.  Eyes:     Pupils: Pupils are equal, round, and reactive to light.  Cardiovascular:     Rate and Rhythm: Normal rate and regular rhythm.  Pulmonary:     Effort: Pulmonary effort is normal.     Breath sounds: Normal  breath sounds.  Abdominal:     General: Abdomen is flat.     Palpations: Abdomen is soft.  Musculoskeletal:        General: Normal range of motion.  Skin:    General: Skin is warm and dry.  Neurological:     General: No focal deficit present.     Mental Status: He is alert. Mental status is at baseline.  Psychiatric:        Attention and Perception: Attention normal.        Mood and Affect: Mood is anxious.        Speech: Speech normal.        Behavior: Behavior normal.        Thought Content: Thought content normal.        Cognition and Memory: Cognition normal.        Judgment: Judgment normal.    Review of Systems  Constitutional: Negative.   HENT: Negative.    Eyes: Negative.   Respiratory: Negative.    Cardiovascular: Negative.   Gastrointestinal: Negative.   Musculoskeletal: Negative.   Skin: Negative.   Neurological: Negative.   Psychiatric/Behavioral:  Negative for depression, hallucinations and suicidal ideas. The patient is nervous/anxious.    Blood pressure (!) 160/97, pulse 96, temperature 98 F (36.7 C), temperature source Oral, resp. rate 18, height 5\' 7"  (1.702 m), weight 76.7 kg, SpO2 98 %. Body mass index is 26.47 kg/m.  Mental Status Per Nursing Assessment::   On Admission:  NA  Demographic Factors:  Male and Caucasian  Loss Factors: NA  Historical Factors: Impulsivity  Risk Reduction Factors:   Employed and Positive social support  Continued Clinical Symptoms:  Severe Anxiety and/or Agitation Alcohol/Substance Abuse/Dependencies  Cognitive Features That Contribute To Risk:  Closed-mindedness    Suicide Risk:  Minimal: No identifiable suicidal ideation.  Patients presenting with no risk factors but with morbid ruminations; may be classified as minimal risk based on the severity of the depressive symptoms    Plan Of Care/Follow-up recommendations:  Other:  Patient denies suicidal ideation.  Behavior is calm.  Patient is not currently  delirious or psychotic.  Agrees to referral for outpatient follow-up  Mordecai Rasmussen, MD 06/08/2022, 1:40 PM

## 2022-06-08 NOTE — BH IP Treatment Plan (Signed)
Interdisciplinary Treatment and Diagnostic Plan Update  06/08/2022 Time of Session: 9:30 AM BENEDETTO RYDER MRN: [id number]  Principal Diagnosis: Hallucinations  Secondary Diagnoses: Principal Problem:   Hallucinations Active Problems:   Hypertension   Anxiety   Current Medications:  Current Facility-Administered Medications  Medication Dose Route Frequency Provider Last Rate Last Admin   alum & mag hydroxide-simeth (MAALOX/MYLANTA) 200-200-20 MG/5ML suspension 30 mL  30 mL Oral Q4H PRN Waldon Merl F, NP       clonazePAM (KLONOPIN) tablet 1 mg  1 mg Oral BID Clapacs, Madie Reno, MD   1 mg at 06/08/22 0755   hydrochlorothiazide (HYDRODIURIL) tablet 25 mg  25 mg Oral Daily Clapacs, John T, MD   25 mg at 06/08/22 0755   pantoprazole (PROTONIX) EC tablet 40 mg  40 mg Oral Daily Clapacs, Madie Reno, MD   40 mg at 06/08/22 1153   PTA Medications: Medications Prior to Admission  Medication Sig Dispense Refill Last Dose   hydrOXYzine (ATARAX) 50 MG tablet Take 50 mg by mouth 3 (three) times daily as needed.   Past Week   mirtazapine (REMERON) 7.5 MG tablet Take 7.5 mg by mouth at bedtime.   Past Week   QUEtiapine (SEROQUEL) 25 MG tablet Take 25 mg by mouth at bedtime.   Past Week   escitalopram (LEXAPRO) 5 MG tablet Take 5 mg by mouth daily. (Patient not taking: Reported on 06/07/2022)   Not Taking    Patient Stressors: Marital or family conflict   Medication change or noncompliance    Patient Strengths: Motivation for treatment/growth  Supportive family/friends   Treatment Modalities: Medication Management, Group therapy, Case management,  1 to 1 session with clinician, Psychoeducation, Recreational therapy.   Physician Treatment Plan for Primary Diagnosis: Hallucinations Long Term Goal(s): Improvement in symptoms so as ready for discharge   Short Term Goals: Ability to identify and develop effective coping behaviors will improve Ability to maintain clinical measurements  within normal limits will improve Compliance with prescribed medications will improve Ability to verbalize feelings will improve Ability to demonstrate self-control will improve  Medication Management: Evaluate patient's response, side effects, and tolerance of medication regimen.  Therapeutic Interventions: 1 to 1 sessions, Unit Group sessions and Medication administration.  Evaluation of Outcomes: Not Met  Physician Treatment Plan for Secondary Diagnosis: Principal Problem:   Hallucinations Active Problems:   Hypertension   Anxiety  Long Term Goal(s): Improvement in symptoms so as ready for discharge   Short Term Goals: Ability to identify and develop effective coping behaviors will improve Ability to maintain clinical measurements within normal limits will improve Compliance with prescribed medications will improve Ability to verbalize feelings will improve Ability to demonstrate self-control will improve     Medication Management: Evaluate patient's response, side effects, and tolerance of medication regimen.  Therapeutic Interventions: 1 to 1 sessions, Unit Group sessions and Medication administration.  Evaluation of Outcomes: Not Met   RN Treatment Plan for Primary Diagnosis: Hallucinations Long Term Goal(s): Knowledge of disease and therapeutic regimen to maintain health will improve  Short Term Goals: Ability to demonstrate self-control, Ability to participate in decision making will improve, Ability to verbalize feelings will improve, Ability to disclose and discuss suicidal ideas, Ability to identify and develop effective coping behaviors will improve, and Compliance with prescribed medications will improve  Medication Management: RN will administer medications as ordered by provider, will assess and evaluate patient's response and provide education to patient for prescribed medication. RN will report any adverse and/or  side effects to prescribing  provider.  Therapeutic Interventions: 1 on 1 counseling sessions, Psychoeducation, Medication administration, Evaluate responses to treatment, Monitor vital signs and CBGs as ordered, Perform/monitor CIWA, COWS, AIMS and Fall Risk screenings as ordered, Perform wound care treatments as ordered.  Evaluation of Outcomes: Not Met   LCSW Treatment Plan for Primary Diagnosis: Hallucinations Long Term Goal(s): Safe transition to appropriate next level of care at discharge, Engage patient in therapeutic group addressing interpersonal concerns.  Short Term Goals: Engage patient in aftercare planning with referrals and resources, Increase social support, Increase ability to appropriately verbalize feelings, Increase emotional regulation, Facilitate acceptance of mental health diagnosis and concerns, and Increase skills for wellness and recovery  Therapeutic Interventions: Assess for all discharge needs, 1 to 1 time with Social worker, Explore available resources and support systems, Assess for adequacy in community support network, Educate family and significant other(s) on suicide prevention, Complete Psychosocial Assessment, Interpersonal group therapy.  Evaluation of Outcomes: Not Met   Progress in Treatment: Attending groups: No. Participating in groups: No. Taking medication as prescribed: Yes. Toleration medication: Yes. Family/Significant other contact made: No, will contact:  once permission is given. Patient understands diagnosis: Yes. Discussing patient identified problems/goals with staff: Yes. Medical problems stabilized or resolved: Yes. Denies suicidal/homicidal ideation: Yes. Issues/concerns per patient self-inventory: No. Other: none  New problem(s) identified: No, Describe:  none  New Short Term/Long Term Goal(s): detox, elimination of symptoms of psychosis, medication management for mood stabilization; elimination of SI thoughts; development of comprehensive mental  wellness/sobriety plan.   Patient Goals:  "getting my medication straight"  Discharge Plan or Barriers: CSW to assist patient in development of appropriate discharge plans.  Patient reports that he does not want to follow up with his current mental health provider due to conflict over medications.  Reason for Continuation of Hospitalization: Anxiety Depression Medical Issues Medication stabilization Suicidal ideation  Estimated Length of Stay:  1-7 days  Last 3 [place] Suicide Severity Risk Score: [place] Admission (Current) from 06/06/2022 in [facility] to Hosp-Admission (Discharged) from 06/05/2022 in [facility] from 01/27/2021 in [facility] No Risk Error: Q3, 4, or 5 should not be populated when Q2 is No No Risk       Last PHQ 2/9 Scores:    11/10/2020   10:20 AM  Depression screen PHQ 2/9  Decreased Interest 1  Down, Depressed, Hopeless 3  PHQ - 2 Score 4  Altered sleeping 2  Tired, decreased energy 0  Change in appetite 3  Feeling bad or failure about yourself  0  Trouble concentrating 1  Moving slowly or fidgety/restless 3  Suicidal thoughts 0  PHQ-9 Score 13  Difficult doing work/chores Extremely dIfficult    Scribe for Treatment Team: Rozann Lesches, Marlinda Mike 06/08/2022 2:58 PM

## 2022-06-08 NOTE — Progress Notes (Signed)
Patient ID: Carl Lowe, male   DOB: Jun 07, 1971, 51 y.o.   MRN: [id number]  Discharge Note:  Patient denies SI/HI/AVH at this time. Discharge instructions, AVS, prescriptions, and transition record gone over with patient. Patient agrees to comply with medication management, follow-up visit, and outpatient therapy. Patient belongings were kept at bedside and there was nothing to return from the locker room. Patient questions and concerns addressed and answered. Patient ambulatory off unit. Patient discharged to home with his Mother.

## 2022-06-08 NOTE — Progress Notes (Signed)
Patient calm and pleasant during assessment denying SI/HI/AVH. Pt observed interacting appropriately with staff and peers on the unit. Pt compliant with medication administration per MD orders. Pt given education, support, and encouragement to be active in his treatment plan. Patient oriented to the unit and his room. Pt being monitored Q 15 minutes for safety per unit protocol, remains safe on the unit.

## 2022-06-08 NOTE — Progress Notes (Signed)
  [facility] Adult Case Management Discharge Plan :  Will you be returning to the same living situation after discharge:  Yes,  pt reports that he is returning home.  At discharge, do you have transportation home?: Yes,  pt reports that his mother will provide transportation. Do you have the ability to pay for your medications: Yes,  [employer] / [employer] [facility]  Release of information consent forms completed and in the chart;  Patient's signature needed at discharge.  Patient to Follow up at:  Follow-up Information     [facility].   Why: Appointment is scheduled for 06/28/2022 at 3:00PM.  Appointment is face to face.  Provider is [place].  New patient forms have been emailed to you.  Please arrive to appointment 20 minutes early, will be charged a $75 if you no show this appointment, CALL AND CANCEL.Bring medicaions, insurance card, ID and discharge summary to appointment. Contact information: [address] [place] [address] [phone]                 Next level of care provider has access to [facility] Link:no  Safety Planning and Suicide Prevention discussed: Yes,  SPE completed with the patient.     Has patient been referred to the Quitline?: Patient refused referral  Patient has been referred for addiction treatment: Pt. refused referral  Harden Mo, LCSW 06/08/2022, 2:27 PM

## 2022-06-08 NOTE — Discharge Summary (Signed)
Physician Discharge Summary Note  Patient:  Carl Lowe is an 51 y.o., male MRN:  [id number] DOB:  04-02-1971 Patient phone:  [phone] (home)  Patient address:   [address] [place] [address],  Total Time spent with patient: 30 minutes  Date of Admission:  06/06/2022 Date of Discharge: 06/08/2022  Reason for Admission: Patient was admitted in transfer from the medical service after being stabilized.  He had presented to the emergency room in a state of agitation and confusion that required heavy sedation for safety.  Principal Problem: Hallucinations Discharge Diagnoses: Principal Problem:   Hallucinations Active Problems:   Hypertension   Anxiety   Past Psychiatric History: Patient gives a history of having had longstanding complaints of anxiety.  Review of the old chart shows that he has been to many providers on 1 occasion or so each to complain of anxiety and request alprazolam.  He states that he now has an outpatient provider in [place] but is dissatisfied because they would not prescribe him alprazolam.  Patient admits that he buys alprazolam off the street to use for his complaints of anxiety.  He had agitated episodes in the past but denies having had an episode of psychosis and agitation as bad as this 1.  No suicide attempts in the relevant past.  Patient also has a past history of alcohol abuse claims he has been sober for 5 years  Past Medical History:  Past Medical History:  Diagnosis Date   Anxiety    Depression     Past Surgical History:  Procedure Laterality Date   CARDIAC SURGERY     Family History:  Family History  Problem Relation Age of Onset   Hypertension Mother    Bipolar disorder Father    Heart disease Father    Dementia Father    COPD Father    Anxiety disorder Brother    Family Psychiatric  History: Anxiety in several family members Social History:  Social History   Substance and Sexual Activity  Alcohol Use Never      Social History   Substance and Sexual Activity  Drug Use Yes   Types: Benzodiazepines, Marijuana   Comment: clonapen, xanax "any benzos" off the street, doesnt' want to do that anymore    Social History   Socioeconomic History   Marital status: Single    Spouse name: Not on file   Number of children: Not on file   Years of education: Not on file   Highest education level: Not on file  Occupational History   Not on file  Tobacco Use   Smoking status: Former   Smokeless tobacco: Current    Types: Snuff  Vaping Use   Vaping Use: Never used  Substance and Sexual Activity   Alcohol use: Never   Drug use: Yes    Types: Benzodiazepines, Marijuana    Comment: clonapen, xanax "any benzos" off the street, doesnt' want to do that anymore   Sexual activity: Not Currently  Other Topics Concern   Not on file  Social History Narrative   Not on file   Social Determinants of Health   Financial Resource Strain: Not on file  Food Insecurity: No Food Insecurity (06/06/2022)   Hunger Vital Sign    Worried About Running Out of Food in the Last Year: Never true    Ran Out of Food in the Last Year: Never true  Transportation Needs: No Transportation Needs (06/06/2022)   PRAPARE - Transportation    Lack  of Transportation (Medical): No    Lack of Transportation (Non-Medical): No  Physical Activity: Not on file  Stress: Not on file  Social Connections: Not on file    Hospital Course: Admitted to the psychiatric unit.  15-minute checks maintained.  Patient did not display any dangerous agitated aggressive or suicidal behavior on the unit.  He was cooperative with intake and treatment.  Blood pressure was running high and he was restarted on hydrochlorothiazide which was the medicine he specifically requested.  He has also been restarted on pantoprazole because of his complaint that he is on chronic proton pump inhibitors for discomfort.  Patient was continued on standing doses of  benzodiazepine.  Trying to put together this whole story it is still unclear what might have happened.  The brief episode that he had in the history are not very consistent with bipolar disorder.  Neither are they really very consistent with a typical anxiety disorder.  On the face of it and the whole thing sounds much more like what happens in acute substance abuse.  Patient has continued to insist that he was not abusing any substances.  I cannot prove that he was abusing substances but at least today he still looks a little shaky and like he is having withdrawal symptoms.  He is not however delirious nor psychotic and he denies suicidal or homicidal thought.  He is requesting discharge from the hospital as soon as possible.  Under the circumstances although there is still a risk that he may abuse them I am going to provide a prescription for clonazepam 1 mg twice a day for a month.  I asked the patient whether he would be still getting Xanax off the street and he told me he would not do that if I gave him the Klonopin.  Education has been provided about Miss use of street drugs.  Patient is strongly encouraged to go to an outpatient provider for follow-up mental health treatment for his complaints of anxiety.  Discontinue IVC if in place.  Discharge with follow-up referral to local mental health.  Physical Findings: AIMS:  , ,  ,  ,    CIWA:    COWS:     Musculoskeletal: Strength & Muscle Tone: within normal limits Gait & Station: normal Patient leans: N/A   Psychiatric Specialty Exam:  Presentation  General Appearance:  Appropriate for Environment  Eye Contact: Good  Speech: Clear and Coherent  Speech Volume: Normal  Handedness: Right   Mood and Affect  Mood: Anxious  Affect: Congruent   Thought Process  Thought Processes: Coherent; Goal Directed  Descriptions of Associations:Intact  Orientation:Full (Time, Place and Person)  Thought Content:WDL  History of  Schizophrenia/Schizoaffective disorder:No  Duration of Psychotic Symptoms:Less than six months  Hallucinations:No data recorded Ideas of Reference:None  Suicidal Thoughts:No data recorded Homicidal Thoughts:No data recorded  Sensorium  Memory: Immediate Good; Recent Fair  Judgment: Poor  Insight: Fair   Art therapist  Concentration: Good  Attention Span: Good  Recall: [employer] of Knowledge: Fair  Language: Fair   Psychomotor Activity  Psychomotor Activity:No data recorded  Assets  Assets: Financial Resources/Insurance; Housing; Desire for Improvement; Physical Health   Sleep  Sleep:No data recorded   Physical Exam: Physical Exam Vitals and nursing note reviewed.  Constitutional:      Appearance: Normal appearance.  HENT:     Head: Normocephalic and atraumatic.     Mouth/Throat:     Pharynx: Oropharynx is clear.  Eyes:  Pupils: Pupils are equal, round, and reactive to light.  Cardiovascular:     Rate and Rhythm: Normal rate and regular rhythm.  Pulmonary:     Effort: Pulmonary effort is normal.     Breath sounds: Normal breath sounds.  Abdominal:     General: Abdomen is flat.     Palpations: Abdomen is soft.  Musculoskeletal:        General: Normal range of motion.  Skin:    General: Skin is warm and dry.  Neurological:     General: No focal deficit present.     Mental Status: He is alert. Mental status is at baseline.  Psychiatric:        Attention and Perception: Attention normal.        Mood and Affect: Mood is anxious.        Speech: Speech normal.        Behavior: Behavior is cooperative.        Thought Content: Thought content normal.        Cognition and Memory: Cognition normal.        Judgment: Judgment normal.    Review of Systems  Constitutional: Negative.   HENT: Negative.    Eyes: Negative.   Respiratory: Negative.    Cardiovascular: Negative.   Gastrointestinal: Negative.   Musculoskeletal: Negative.    Skin: Negative.   Neurological: Negative.   Psychiatric/Behavioral:  Negative for depression and suicidal ideas. The patient is nervous/anxious.    Blood pressure (!) 160/97, pulse 96, temperature 98 F (36.7 C), temperature source Oral, resp. rate 18, height 5\' 7"  (1.702 m), weight 76.7 kg, SpO2 98 %. Body mass index is 26.47 kg/m.   Social History   Tobacco Use  Smoking Status Former  Smokeless Tobacco Current   Types: Snuff   Tobacco Cessation:  A prescription for an FDA-approved tobacco cessation medication was offered at discharge and the patient refused   Blood Alcohol level:  Lab Results  Component Value Date   ETH <10 06/05/2022    Metabolic Disorder Labs:  No results found for: "HGBA1C", "MPG" No results found for: "PROLACTIN" No results found for: "CHOL", "TRIG", "HDL", "CHOLHDL", "VLDL", "LDLCALC"  See Psychiatric Specialty Exam and Suicide Risk Assessment completed by Attending Physician prior to discharge.  Discharge destination:  Home  Is patient on multiple antipsychotic therapies at discharge:  No   Has Patient had three or more failed trials of antipsychotic monotherapy by history:  No  Recommended Plan for Multiple Antipsychotic Therapies: NA  Discharge Instructions     Diet - low sodium heart healthy   Complete by: As directed    Increase activity slowly   Complete by: As directed       Allergies as of 06/08/2022   No Known Allergies      Medication List     STOP taking these medications    escitalopram 5 MG tablet Commonly known as: LEXAPRO   hydrOXYzine 50 MG tablet Commonly known as: ATARAX   mirtazapine 7.5 MG tablet Commonly known as: REMERON   QUEtiapine 25 MG tablet Commonly known as: SEROQUEL       TAKE these medications      Indication  clonazePAM 1 MG tablet Commonly known as: KLONOPIN Take 1 tablet (1 mg total) by mouth 2 (two) times daily.  Indication: Feeling Anxious   hydrochlorothiazide 25 MG  tablet Commonly known as: HYDRODIURIL Take 1 tablet (25 mg total) by mouth daily. Start taking on: June 09, 2022  Indication: High Blood Pressure Disorder   pantoprazole 40 MG tablet Commonly known as: PROTONIX Take 1 tablet (40 mg total) by mouth daily. Start taking on: June 09, 2022  Indication: Gastroesophageal Reflux Disease         Follow-up recommendations:  Other:  Patient is strongly encouraged to follow-up with local mental health agencies such as [facility].  Strongly encouraged to avoid abusing drugs or buying street drugs including alprazolam  Comments: Prescription given for 1 month only with no refills.  He already has a psychiatrist in place he could follow-up with or could go to other local providers.  Signed: Mordecai Rasmussen, MD 06/08/2022, 1:44 PM

## 2022-06-28 DIAGNOSIS — R69 Illness, unspecified: Secondary | ICD-10-CM | POA: Diagnosis not present

## 2022-07-12 DIAGNOSIS — R69 Illness, unspecified: Secondary | ICD-10-CM | POA: Diagnosis not present

## 2022-09-22 ENCOUNTER — Other Ambulatory Visit: Payer: Self-pay | Admitting: Nurse Practitioner

## 2022-09-22 DIAGNOSIS — I10 Essential (primary) hypertension: Secondary | ICD-10-CM

## 2022-09-23 NOTE — Telephone Encounter (Signed)
Medication D/C 05/1322.

## 2023-01-16 ENCOUNTER — Emergency Department
Admission: EM | Admit: 2023-01-16 | Discharge: 2023-01-16 | Disposition: A | Discharge status: Home / Self Care | Payer: Medicaid Other | Attending: Emergency Medicine | Admitting: Emergency Medicine

## 2023-01-16 ENCOUNTER — Other Ambulatory Visit: Payer: Self-pay

## 2023-01-16 DIAGNOSIS — Z760 Encounter for issue of repeat prescription: Secondary | ICD-10-CM | POA: Diagnosis not present

## 2023-01-16 DIAGNOSIS — F1320 Sedative, hypnotic or anxiolytic dependence, uncomplicated: Secondary | ICD-10-CM | POA: Diagnosis not present

## 2023-01-16 DIAGNOSIS — F419 Anxiety disorder, unspecified: Secondary | ICD-10-CM | POA: Diagnosis present

## 2023-01-16 DIAGNOSIS — F1310 Sedative, hypnotic or anxiolytic abuse, uncomplicated: Secondary | ICD-10-CM | POA: Insufficient documentation

## 2023-01-16 MED ORDER — ALPRAZOLAM 0.5 MG PO TABS
1.0000 mg | ORAL_TABLET | Freq: Once | ORAL | Status: AC
  Administered 2023-01-16: 1 mg via ORAL
  Filled 2023-01-16: qty 2

## 2023-01-16 NOTE — Consult Note (Signed)
[facility] Face-to-Face Psychiatry Consult   Reason for Consult:  Chronic anxiety, intermittently on alprazolam, now out Referring Physician:  Dr. Marisa Severin Patient Identification: Carl Lowe MRN:  [id number] Principal Diagnosis: <principal problem not specified> Diagnosis:  Active Problems:   Benzodiazepine abuse (HCC)  Total Time spent with patient: 45 minutes  Subjective:   Carl Lowe is a 52 y.o. male presenting to [facility] on 01/16/23 w/ request for refill on Xanax.   HPI:    Patient seen at bedside. Alert and oriented x 3. Reports he came in today because "I've run out of my, I guess, it's generic, is xanax". He reports he has had ongoing prescriptions for xanax. Discussed with patient that per review of pdmp, does not appear that he has had an active prescription for benzodiazepine since 2023. Discussed from available records, appears pt was prescribed clonazepam 1mg  60 tablets for 30 days on 06/08/2022, and then lorazepam 1mg  30 tablets for 30 days on 06/28/22, and lorazepam 1mg  30 tablets for 15 days on 06/28/22 (with 1 refill). Patient admits he has not been prescribed benzodiazepines from provider since last year. He states he never followed up due to not being able to get on zoom appointment. Patient states since last year, he has been buying benzodiazepines, "2mg /day of whatever I can get, Klonopin, Xanax, whatever" since December. He reports most recently, his drug dealer has "run out of his supply". Discussed with patient concerns about benzodiazepine abuse. Discussed need to establish outpatient psychiatry provider. Discussed recommendation to taper off outpatient, to consider substance abuse treatment. Patient states he has no interest in stopping benzodiazepine use. He reports trying "CBD edibles" several months ago. He denies use of alcohol (last drink was 7 years ago), nicotine (last use was several years ago), methamphetamines, opioids.  Patient denies suicidal,  homicidal ideations. He denies auditory visual hallucinations or paranoia. There is no evidence of agitation, aggression, distractibility or internal preoccupation. No delusions or paranoia elicited.   Patient does not meet criteria for inpatient psychiatric admission. Psych cleared.  Past Psychiatric History: Anxiety  Risk to Self: No Risk to Others: No Prior Inpatient Therapy: Yes Prior Outpatient Therapy: No, prior psychiatry services  Past Medical History:  Past Medical History:  Diagnosis Date   Anxiety    Depression     Past Surgical History:  Procedure Laterality Date   CARDIAC SURGERY     Family History:  Family History  Problem Relation Age of Onset   Hypertension Mother    Bipolar disorder Father    Heart disease Father    Dementia Father    COPD Father    Anxiety disorder Brother    Family Psychiatric  History: None reported Social History:  Social History   Substance and Sexual Activity  Alcohol Use Never     Social History   Substance and Sexual Activity  Drug Use Yes   Types: Benzodiazepines, Marijuana   Comment: clonapen, xanax "any benzos" off the street, doesnt' want to do that anymore    Social History   Socioeconomic History   Marital status: Single    Spouse name: Not on file   Number of children: Not on file   Years of education: Not on file   Highest education level: Not on file  Occupational History   Not on file  Tobacco Use   Smoking status: Former   Smokeless tobacco: Current    Types: Snuff  Vaping Use   Vaping Use: Never used  Substance and Sexual  Activity   Alcohol use: Never   Drug use: Yes    Types: Benzodiazepines, Marijuana    Comment: clonapen, xanax "any benzos" off the street, doesnt' want to do that anymore   Sexual activity: Not Currently  Other Topics Concern   Not on file  Social History Narrative   Not on file   Social Determinants of Health   Financial Resource Strain: Not on file  Food Insecurity: No  Food Insecurity (06/06/2022)   Hunger Vital Sign    Worried About Running Out of Food in the Last Year: Never true    Ran Out of Food in the Last Year: Never true  Transportation Needs: No Transportation Needs (06/06/2022)   PRAPARE - Administrator, Civil Service (Medical): No    Lack of Transportation (Non-Medical): No  Physical Activity: Not on file  Stress: Not on file  Social Connections: Not on file   Additional Social History:    Allergies:  No Known Allergies  Labs: No results found for this or any previous visit (from the past 48 hour(s)).  No current facility-administered medications for this encounter.   Current Outpatient Medications  Medication Sig Dispense Refill   clonazePAM (KLONOPIN) 1 MG tablet Take 1 tablet (1 mg total) by mouth 2 (two) times daily. 60 tablet 0   hydrochlorothiazide (HYDRODIURIL) 25 MG tablet Take 1 tablet (25 mg total) by mouth daily. 30 tablet 1   pantoprazole (PROTONIX) 40 MG tablet Take 1 tablet (40 mg total) by mouth daily. 30 tablet 1    Musculoskeletal: Strength & Muscle Tone:  sitting on assessment Gait & Station:  sitting on assessment Patient leans:  sitting on assessment  Psychiatric Specialty Exam:  Presentation  General Appearance:  Casual; Fairly Groomed  Eye Contact: Good  Speech: Clear and Coherent; Normal Rate  Speech Volume: Normal  Handedness: Right   Mood and Affect  Mood: Anxious  Affect: Congruent   Thought Process  Thought Processes: Coherent; Goal Directed; Linear  Descriptions of Associations:Intact  Orientation:Full (Time, Place and Person)  Thought Content:Logical  History of Schizophrenia/Schizoaffective disorder:No  Duration of Psychotic Symptoms:Less than six months  Hallucinations:Hallucinations: None  Ideas of Reference:None  Suicidal Thoughts:Suicidal Thoughts: No  Homicidal Thoughts:Homicidal Thoughts: No   Sensorium  Memory: Immediate Good; Recent  Good; Remote Good  Judgment: Poor  Insight: Shallow   Executive Functions  Concentration: Fair  Attention Span: Fair  Recall: Fair  Fund of Knowledge: Fair  Language: Fair   Psychomotor Activity  Psychomotor Activity: Psychomotor Activity: Normal   Assets  Assets: Communication Skills; Financial Resources/Insurance; Housing; Resilience; Social Support   Sleep  Sleep: Sleep: Poor   Physical Exam: Physical Exam Constitutional:      General: He is not in acute distress.    Appearance: He is not ill-appearing, toxic-appearing or diaphoretic.  Eyes:     General: No scleral icterus. Cardiovascular:     Rate and Rhythm: Normal rate.  Pulmonary:     Effort: Pulmonary effort is normal. No respiratory distress.  Neurological:     Mental Status: He is alert and oriented to person, place, and time.  Psychiatric:        Attention and Perception: Attention and perception normal.        Mood and Affect: Mood is anxious.        Speech: Speech normal.        Behavior: Behavior normal. Behavior is cooperative.        Thought Content: Thought  content normal.        Cognition and Memory: Cognition and memory normal.    Review of Systems  Constitutional:  Negative for chills and fever.  Respiratory:  Negative for shortness of breath.   Cardiovascular:  Negative for chest pain and palpitations.  Gastrointestinal:  Negative for abdominal pain.  Neurological:  Negative for headaches.  Psychiatric/Behavioral:  The patient is nervous/anxious.    Blood pressure (!) 157/88, pulse 60, temperature 98.6 F (37 C), temperature source Oral, resp. rate 20, height 5\' 7"  (1.702 m), weight 76.2 kg, SpO2 97 %. Body mass index is 26.31 kg/m.  Treatment Plan Summary: Plan Patient is psychiatrically cleared. Does not meet criteria for inpatient psychiatric admission.  Disposition: No evidence of imminent risk to self or others at present.   Patient does not meet criteria for  psychiatric inpatient admission. Discussed crisis plan, support from social network, calling 911, coming to the Emergency Department, and calling Suicide Hotline.  Lauree Chandler, NP 01/16/2023 2:45 PM

## 2023-01-16 NOTE — ED Triage Notes (Signed)
Pt is here today for a refill of his Xanax; Patient states that he has been seen numerous times for the same and when patient was educated that Xanax is intended for short term use and not long term use and that the Emergency Department does not refill narcotic prescriptions, patient said that he didn't want to buy them off the streets anymore; He does have psychiatry established but says he could not figure out how to use Zoom to talk with the provider and he didn't want to pay $180 for the appointment

## 2023-01-16 NOTE — ED Provider Notes (Signed)
[facility] Provider Note    Event Date/Time   First MD Initiated Contact with Patient 01/16/23 1229     (approximate)   History    HPI  Carl Lowe is a 52 y.o. male with a history of anxiety who presents requesting a prescription for alprazolam for anxiety.  He states that he usually takes 1 mg of alprazolam twice daily but has not been prescribed it recently.  He states that he has been buying it off the street.  He states he has been without it for the last 4 days and is having withdrawal symptoms including difficulty sleeping, increased anxiety, and feeling jittery.  The patient denies any thoughts of wanting to harm himself or others.  He states that he was supposed to be switched to a different medication but then had problems with connecting on Zoom to follow-up with an outpatient provider.   I reviewed the past medical records.  The patient was admitted to inpatient psychiatry in November of last year after initially presenting with agitation and confusion; per the discharge summary by Dr. Toni Amend and was prescribed Klonopin at that time.  I also reviewed his records in the PDMP and the last benzodiazepine prescription was in December 2023.   Physical Exam   Triage Vital Signs: ED Triage Vitals  Enc Vitals Group     BP 01/16/23 1113 (!) 186/103     Pulse Rate 01/16/23 1113 (!) 115     Resp 01/16/23 1113 (!) 26     Temp 01/16/23 1113 98.6 F (37 C)     Temp Source 01/16/23 1113 Oral     SpO2 01/16/23 1113 97 %     Weight 01/16/23 1112 168 lb (76.2 kg)     Height 01/16/23 1112 5\' 7"  (1.702 m)     Head Circumference --      Peak Flow --      Pain Score --      Pain Loc --      Pain Edu? --      Excl. in GC? --     Most recent vital signs: Vitals:   01/16/23 1113 01/16/23 1352  BP: (!) 186/103 (!) 157/88  Pulse: (!) 115 60  Resp: (!) 26 20  Temp: 98.6 F (37 C)   SpO2: 97% 97%     General: Awake, no distress.  CV:  Good  peripheral perfusion.  Tachycardic, regular rhythm. Resp:  Normal effort.  Abd:  No distention.  Other:  Normal speech.  No significant tremor or tongue fasciculation.  Motor intact in all extremities.   ED Results / Procedures / Treatments   Labs (all labs ordered are listed, but only abnormal results are displayed) Labs Reviewed - No data to display   EKG    RADIOLOGY    PROCEDURES:  Critical Care performed: No  Procedures   MEDICATIONS ORDERED IN ED: Medications  ALPRAZolam (XANAX) tablet 1 mg (1 mg Oral Given 01/16/23 1351)     IMPRESSION / MDM / ASSESSMENT AND PLAN / ED COURSE  I reviewed the triage vital signs and the nursing notes.  52 year old male with PMH as noted above presents requesting a prescription for alprazolam for chronic anxiety.  The patient has been buying on the street, has not had it for the last several days, and has increasing anxiety, difficulty sleeping, and feeling jittery.  The patient is tachycardic and slightly hypertensive with otherwise normal vital signs.  He is alert and  oriented.  Differential diagnosis includes, but is not limited to, anxiety, benzodiazepine withdrawal.  I will give a single dose of 1 mg of alprazolam here due to the patient's mild withdrawal symptoms.  I have consulted psychiatry for further evaluation.  Patient's presentation is most consistent with exacerbation of chronic illness.  ----------------------------------------- 4:00 PM on 01/16/2023 -----------------------------------------  I consulted and discussed the case with NP Nedra Hai from psychiatry who advised that the patient does not meet criteria for inpatient psychiatric admission and recommends substance abuse treatment for benzodiazepines.  The patient initially reported to her that he is not interested in discontinuing the alprazolam, but now states he would like evaluation for inpatient detox.  The heart rate has normalized.  Blood pressure is improved.   I have signed the patient out to the oncoming ED physician Dr. Roxan Hockey.   FINAL CLINICAL IMPRESSION(S) / ED DIAGNOSES   Final diagnoses:  Benzodiazepine dependence (HCC)     Rx / DC Orders   ED Discharge Orders     None        Note:  This document was prepared using Dragon voice recognition software and may include unintentional dictation errors.    Dionne Bucy, MD 01/16/23 [id number]

## 2023-01-17 DIAGNOSIS — F1320 Sedative, hypnotic or anxiolytic dependence, uncomplicated: Secondary | ICD-10-CM | POA: Diagnosis not present

## 2023-01-18 DIAGNOSIS — F1320 Sedative, hypnotic or anxiolytic dependence, uncomplicated: Secondary | ICD-10-CM | POA: Diagnosis not present

## 2023-01-19 DIAGNOSIS — F419 Anxiety disorder, unspecified: Secondary | ICD-10-CM | POA: Diagnosis not present

## 2023-01-19 DIAGNOSIS — R4587 Impulsiveness: Secondary | ICD-10-CM | POA: Diagnosis not present

## 2023-01-19 DIAGNOSIS — F1320 Sedative, hypnotic or anxiolytic dependence, uncomplicated: Secondary | ICD-10-CM | POA: Diagnosis not present

## 2023-01-19 DIAGNOSIS — F13932 Sedative, hypnotic or anxiolytic use, unspecified with withdrawal with perceptual disturbances: Secondary | ICD-10-CM | POA: Diagnosis not present

## 2023-01-19 DIAGNOSIS — R000 Tachycardia, unspecified: Secondary | ICD-10-CM | POA: Diagnosis not present

## 2023-01-19 DIAGNOSIS — R443 Hallucinations, unspecified: Secondary | ICD-10-CM | POA: Diagnosis not present

## 2023-01-19 DIAGNOSIS — R442 Other hallucinations: Secondary | ICD-10-CM | POA: Diagnosis not present

## 2023-01-19 DIAGNOSIS — F1910 Other psychoactive substance abuse, uncomplicated: Secondary | ICD-10-CM | POA: Diagnosis not present

## 2023-01-19 DIAGNOSIS — F05 Delirium due to known physiological condition: Secondary | ICD-10-CM | POA: Diagnosis not present

## 2023-01-19 DIAGNOSIS — R339 Retention of urine, unspecified: Secondary | ICD-10-CM | POA: Diagnosis not present

## 2023-01-19 DIAGNOSIS — R441 Visual hallucinations: Secondary | ICD-10-CM | POA: Diagnosis not present

## 2023-01-19 DIAGNOSIS — R251 Tremor, unspecified: Secondary | ICD-10-CM | POA: Diagnosis not present

## 2023-01-19 DIAGNOSIS — I10 Essential (primary) hypertension: Secondary | ICD-10-CM | POA: Diagnosis not present

## 2023-01-19 DIAGNOSIS — R451 Restlessness and agitation: Secondary | ICD-10-CM | POA: Diagnosis not present

## 2023-01-19 DIAGNOSIS — U071 COVID-19: Secondary | ICD-10-CM | POA: Diagnosis not present

## 2023-01-19 DIAGNOSIS — R4182 Altered mental status, unspecified: Secondary | ICD-10-CM | POA: Diagnosis not present

## 2023-01-19 DIAGNOSIS — F13939 Sedative, hypnotic or anxiolytic use, unspecified with withdrawal, unspecified: Secondary | ICD-10-CM | POA: Diagnosis not present

## 2023-01-19 DIAGNOSIS — G479 Sleep disorder, unspecified: Secondary | ICD-10-CM | POA: Diagnosis not present

## 2023-01-19 DIAGNOSIS — F1390 Sedative, hypnotic, or anxiolytic use, unspecified, uncomplicated: Secondary | ICD-10-CM | POA: Diagnosis not present

## 2023-01-19 DIAGNOSIS — F29 Unspecified psychosis not due to a substance or known physiological condition: Secondary | ICD-10-CM | POA: Diagnosis not present

## 2023-01-19 DIAGNOSIS — R9431 Abnormal electrocardiogram [ECG] [EKG]: Secondary | ICD-10-CM | POA: Diagnosis not present

## 2023-01-19 DIAGNOSIS — F13232 Sedative, hypnotic or anxiolytic dependence with withdrawal with perceptual disturbance: Secondary | ICD-10-CM | POA: Diagnosis not present

## 2023-01-20 DIAGNOSIS — R443 Hallucinations, unspecified: Secondary | ICD-10-CM | POA: Diagnosis not present

## 2023-01-20 DIAGNOSIS — F1910 Other psychoactive substance abuse, uncomplicated: Secondary | ICD-10-CM | POA: Diagnosis not present

## 2023-01-21 DIAGNOSIS — F1390 Sedative, hypnotic, or anxiolytic use, unspecified, uncomplicated: Secondary | ICD-10-CM | POA: Diagnosis not present

## 2023-01-21 DIAGNOSIS — F05 Delirium due to known physiological condition: Secondary | ICD-10-CM | POA: Diagnosis not present

## 2023-01-21 DIAGNOSIS — R451 Restlessness and agitation: Secondary | ICD-10-CM | POA: Diagnosis not present

## 2023-01-21 DIAGNOSIS — F29 Unspecified psychosis not due to a substance or known physiological condition: Secondary | ICD-10-CM | POA: Diagnosis not present

## 2023-01-23 DIAGNOSIS — F1910 Other psychoactive substance abuse, uncomplicated: Secondary | ICD-10-CM | POA: Diagnosis not present

## 2023-01-23 DIAGNOSIS — R443 Hallucinations, unspecified: Secondary | ICD-10-CM | POA: Diagnosis not present

## 2023-01-23 DIAGNOSIS — R339 Retention of urine, unspecified: Secondary | ICD-10-CM | POA: Diagnosis not present

## 2023-01-23 DIAGNOSIS — F419 Anxiety disorder, unspecified: Secondary | ICD-10-CM | POA: Diagnosis not present

## 2023-01-24 DIAGNOSIS — F1910 Other psychoactive substance abuse, uncomplicated: Secondary | ICD-10-CM | POA: Diagnosis not present

## 2023-01-24 DIAGNOSIS — R339 Retention of urine, unspecified: Secondary | ICD-10-CM | POA: Diagnosis not present

## 2023-01-24 DIAGNOSIS — R443 Hallucinations, unspecified: Secondary | ICD-10-CM | POA: Diagnosis not present

## 2023-01-24 DIAGNOSIS — F419 Anxiety disorder, unspecified: Secondary | ICD-10-CM | POA: Diagnosis not present

## 2023-01-24 DIAGNOSIS — U071 COVID-19: Secondary | ICD-10-CM | POA: Diagnosis not present

## 2023-01-25 DIAGNOSIS — F29 Unspecified psychosis not due to a substance or known physiological condition: Secondary | ICD-10-CM | POA: Diagnosis not present

## 2023-01-25 DIAGNOSIS — F13932 Sedative, hypnotic or anxiolytic use, unspecified with withdrawal with perceptual disturbances: Secondary | ICD-10-CM | POA: Diagnosis not present

## 2023-01-25 DIAGNOSIS — G479 Sleep disorder, unspecified: Secondary | ICD-10-CM | POA: Diagnosis not present

## 2023-01-25 DIAGNOSIS — F1390 Sedative, hypnotic, or anxiolytic use, unspecified, uncomplicated: Secondary | ICD-10-CM | POA: Diagnosis not present

## 2023-01-25 DIAGNOSIS — F419 Anxiety disorder, unspecified: Secondary | ICD-10-CM | POA: Diagnosis not present

## 2023-02-02 ENCOUNTER — Ambulatory Visit (INDEPENDENT_AMBULATORY_CARE_PROVIDER_SITE_OTHER): Payer: 59 | Admitting: Physician Assistant

## 2023-02-02 ENCOUNTER — Encounter: Payer: Self-pay | Admitting: Physician Assistant

## 2023-02-02 VITALS — BP 118/77 | HR 73 | Temp 98.1°F | Ht 67.0 in | Wt 174.8 lb

## 2023-02-02 DIAGNOSIS — F419 Anxiety disorder, unspecified: Secondary | ICD-10-CM | POA: Diagnosis not present

## 2023-02-02 DIAGNOSIS — F23 Brief psychotic disorder: Secondary | ICD-10-CM

## 2023-02-02 DIAGNOSIS — I10 Essential (primary) hypertension: Secondary | ICD-10-CM | POA: Diagnosis not present

## 2023-02-02 DIAGNOSIS — R443 Hallucinations, unspecified: Secondary | ICD-10-CM

## 2023-02-02 DIAGNOSIS — F1310 Sedative, hypnotic or anxiolytic abuse, uncomplicated: Secondary | ICD-10-CM | POA: Diagnosis not present

## 2023-02-02 DIAGNOSIS — Z09 Encounter for follow-up examination after completed treatment for conditions other than malignant neoplasm: Secondary | ICD-10-CM

## 2023-02-02 MED ORDER — HYDROCHLOROTHIAZIDE 12.5 MG PO TABS
12.5000 mg | ORAL_TABLET | Freq: Every day | ORAL | 1 refills | Status: AC
Start: 2023-02-02 — End: ?

## 2023-02-02 NOTE — Progress Notes (Signed)
Acute Office Visit   Patient: Carl Lowe   DOB: 17-Jul-1971   52 y.o. Male  MRN: [id number] Visit Date: 02/02/2023  Today's healthcare provider: Oswaldo Conroy Bobbie Valletta, PA-C  Introduced myself to the patient as a Secondary school teacher and provided education on APPs in clinical practice.    Chief Complaint  Patient presents with   Hospitalization Follow-up   Depression   Anxiety   Panic Attack    Patient says he was recently hospitalized due to anxiety, depression and panic attacks. Patient says he was prescribed Lexapro, but it takes a while to work for him.    Medication Refill    Patient is requesting a refill on his Protonix and says the stressors cause his Acid Reflux.    Hypertension    Patient is requesting a refill on his blood pressure medication.    Subjective    HPI HPI     Panic Attack    Additional comments: Patient says he was recently hospitalized due to anxiety, depression and panic attacks. Patient says he was prescribed Lexapro, but it takes a while to work for him.         Medication Refill    Additional comments: Patient is requesting a refill on his Protonix and says the stressors cause his Acid Reflux.         Hypertension    Additional comments: Patient is requesting a refill on his blood pressure medication.       Last edited by Malen Gauze, CMA on 02/02/2023  2:28 PM.        He reports he is doing a little bit better He states he is less jittery, is sleeping and eating regularly He reports he did pick up his dc prescriptions from the hospitalization - is concerned about future refills and maintaining He is taking thiamine and folate supplements as directed   He reports he has run out of hydrochlorothiazide and would like to go back on it  He reports he has not had dizziness, leg swelling, chest pain Reviewed recent BP measures from hospital- these were high at time of dc  He is not established with psychiatry provider - states he did not  seek one as he is only on Lexapro  He states he is waiting on Lexapro to reach full therapeutic effects  States he has a "friend that I can get a Valium from if I need it for anxiety attacks"  He declines referral to therapy services today and is hesitant regarding psychiatry      02/02/2023    2:29 PM 11/10/2020   10:20 AM  Depression screen PHQ 2/9  Decreased Interest 0 1  Down, Depressed, Hopeless 2 3  PHQ - 2 Score 2 4  Altered sleeping 2 2  Tired, decreased energy 1 0  Change in appetite 2 3  Feeling bad or failure about yourself  0 0  Trouble concentrating 0 1  Moving slowly or fidgety/restless 0 3  Suicidal thoughts 0 0  PHQ-9 Score 7 13  Difficult doing work/chores Somewhat difficult Extremely dIfficult       02/02/2023    2:30 PM 11/10/2020   10:22 AM  GAD 7 : Generalized Anxiety Score  Nervous, Anxious, on Edge 3 3  Control/stop worrying 1 3  Worry too much - different things 2 3  Trouble relaxing 2 3  Restless 1 2  Easily annoyed or irritable 2 2  Afraid - awful  might happen 1 2  Total GAD 7 Score 12 18  Anxiety Difficulty Somewhat difficult Very difficult     Transition of Care Hospital Follow up.   Hospital/Facility:[facility]  D/C Physician: Rocco Serene, MD  D/C Date: 01/25/23  Records Requested:  Records Received:  Records Reviewed:   Diagnoses on Discharge:  Principal Problem (Resolved): Benzodiazepine abuse (CMS-HCC) (POA: No) Active Problems: * No active hospital problems. * Resolved Problems: Altered mental status (POA: Yes) Withdrawal from benzodiazepine, with perceptual disturbance (CMS-HCC) (POA: Unknown) Visual hallucinations (POA: Yes)   Date of interactive Contact within 48 hours of discharge: unsuccessful - 2 attempts made  Contact was through: direct  Date of 7 day or 14 day face-to-face visit:    within 14 days  Outpatient Encounter Medications as of 02/02/2023  Medication Sig   escitalopram (LEXAPRO) 5 MG tablet  Take 1 tablet by mouth daily.   folic acid (FOLVITE) 1 MG tablet Take 1 tablet by mouth daily.   melatonin 3 MG TABS tablet Take by mouth.   thiamine (VITAMIN B1) 100 MG tablet Take 1 tablet by mouth daily.   [DISCONTINUED] clonazePAM (KLONOPIN) 1 MG tablet Take 1 tablet (1 mg total) by mouth 2 (two) times daily.   [DISCONTINUED] hydrochlorothiazide (HYDRODIURIL) 25 MG tablet Take 1 tablet (25 mg total) by mouth daily.   hydrochlorothiazide (HYDRODIURIL) 12.5 MG tablet Take 1 tablet (12.5 mg total) by mouth daily.   pantoprazole (PROTONIX) 40 MG tablet Take 1 tablet (40 mg total) by mouth daily. (Patient not taking: Reported on 02/02/2023)   No facility-administered encounter medications on file as of 02/02/2023.    Diagnostic Tests Reviewed/Disposition:   Consults: Psychiatry   Discharge Instructions  Disease/illness Education: reviewed that he needs to avoid using any benzodiazapines given recent hospitalization   Home Health/Community Services Discussions/Referrals:  Establishment or re-establishment of referral orders for community resources: referral to Psychiatry placed   Discussion with other health care providers: none   Assessment and Support of treatment regimen adherence:  Appointments Coordinated with:   Education for self-management, independent living, and ADLs: none     Medications: Outpatient Medications Prior to Visit  Medication Sig   escitalopram (LEXAPRO) 5 MG tablet Take 1 tablet by mouth daily.   folic acid (FOLVITE) 1 MG tablet Take 1 tablet by mouth daily.   melatonin 3 MG TABS tablet Take by mouth.   thiamine (VITAMIN B1) 100 MG tablet Take 1 tablet by mouth daily.   [DISCONTINUED] clonazePAM (KLONOPIN) 1 MG tablet Take 1 tablet (1 mg total) by mouth 2 (two) times daily.   [DISCONTINUED] hydrochlorothiazide (HYDRODIURIL) 25 MG tablet Take 1 tablet (25 mg total) by mouth daily.   pantoprazole (PROTONIX) 40 MG tablet Take 1 tablet (40 mg total) by  mouth daily. (Patient not taking: Reported on 02/02/2023)   No facility-administered medications prior to visit.    Review of Systems  Constitutional:  Negative for chills and fever.  HENT:  Negative for tinnitus.   Eyes:  Negative for photophobia and visual disturbance.  Respiratory:  Negative for cough, shortness of breath and wheezing.   Cardiovascular:  Negative for chest pain, palpitations and leg swelling.  Neurological:  Negative for dizziness, tremors, syncope, weakness, light-headedness and numbness.  Psychiatric/Behavioral:  Negative for hallucinations.        Objective    BP 118/77   Pulse 73   Temp 98.1 F (36.7 C) (Oral)   Ht 5\' 7"  (1.702 m)   Wt 174 lb  12.8 oz (79.3 kg)   SpO2 95%   BMI 27.38 kg/m    Physical Exam Vitals reviewed.  Constitutional:      General: He is awake.     Appearance: Normal appearance. He is well-developed and well-groomed.  HENT:     Head: Normocephalic and atraumatic.  Cardiovascular:     Rate and Rhythm: Normal rate and regular rhythm.     Heart sounds: Normal heart sounds.  Pulmonary:     Effort: Pulmonary effort is normal.     Breath sounds: Normal breath sounds.  Neurological:     Mental Status: He is alert.  Psychiatric:        Behavior: Behavior is cooperative.       No results found for any visits on 02/02/23.  Assessment & Plan      Return in about 8 weeks (around 03/30/2023) for anxiety follow up .      Problem List Items Addressed This Visit       Cardiovascular and Mediastinum   Hypertension - Primary    Chronic, historic condition Refills provided for hydrochlorothiazide 12.5 mg po every day  Continue current regimen Follow up in 3 months or sooner if concerns arise       Relevant Medications   hydrochlorothiazide (HYDRODIURIL) 12.5 MG tablet     Other   Anxiety   Relevant Medications   escitalopram (LEXAPRO) 5 MG tablet   Other Relevant Orders   Ambulatory referral to Psychiatry   Acute  psychosis [facility])   Relevant Orders   Ambulatory referral to Psychiatry   Hallucinations   Benzodiazepine abuse (HCC)    Unsure of chronicity Patient was hospitalized for abuse and withdrawal from 01/19/23-01/25/23  Reviewed importance of taking thiamine and folate supplements as well as not taking any benzodiazapines moving forward Referral to psychiatry provided for assistance with substance abuse Patient declines therapy services at this time  Continue current regimen of Lexapro 5 mg PO every day for now to assist with anxiety  Follow up in 8 weeks or sooner if concerns arise.       Relevant Orders   Ambulatory referral to Psychiatry   Other Visit Diagnoses     Hospital discharge follow-up     Reviewed ED and hospital dc notes along with labs and imaging results  See Benzodiazapine abuse A&P above for management plan         Return in about 8 weeks (around 03/30/2023) for anxiety follow up .   I, Prince Olivier E Dorla Guizar, PA-C, have reviewed all documentation for this visit. The documentation on 02/06/23 for the exam, diagnosis, procedures, and orders are all accurate and complete.   Jacquelin Hawking, MHS, PA-C [facility]

## 2023-02-06 NOTE — Assessment & Plan Note (Signed)
Chronic, historic condition Refills provided for hydrochlorothiazide 12.5 mg po every day  Continue current regimen Follow up in 3 months or sooner if concerns arise

## 2023-02-06 NOTE — Assessment & Plan Note (Signed)
Unsure of chronicity Patient was hospitalized for abuse and withdrawal from 01/19/23-01/25/23  Reviewed importance of taking thiamine and folate supplements as well as not taking any benzodiazapines moving forward Referral to psychiatry provided for assistance with substance abuse Patient declines therapy services at this time  Continue current regimen of Lexapro 5 mg PO every day for now to assist with anxiety  Follow up in 8 weeks or sooner if concerns arise.

## 2023-02-22 ENCOUNTER — Ambulatory Visit
Admission: EM | Admit: 2023-02-22 | Discharge: 2023-02-22 | Disposition: A | Discharge status: Home / Self Care | Payer: 59 | Attending: Emergency Medicine | Admitting: Emergency Medicine

## 2023-02-22 ENCOUNTER — Other Ambulatory Visit: Payer: Self-pay

## 2023-02-22 ENCOUNTER — Encounter: Payer: Self-pay | Admitting: Emergency Medicine

## 2023-02-22 ENCOUNTER — Emergency Department
Admission: EM | Admit: 2023-02-22 | Discharge: 2023-02-22 | Disposition: A | Discharge status: Home / Self Care | Payer: 59 | Attending: Emergency Medicine | Admitting: Emergency Medicine

## 2023-02-22 DIAGNOSIS — W19XXXA Unspecified fall, initial encounter: Secondary | ICD-10-CM

## 2023-02-22 DIAGNOSIS — S0993XA Unspecified injury of face, initial encounter: Secondary | ICD-10-CM | POA: Diagnosis not present

## 2023-02-22 DIAGNOSIS — I10 Essential (primary) hypertension: Secondary | ICD-10-CM | POA: Diagnosis not present

## 2023-02-22 DIAGNOSIS — W1840XA Slipping, tripping and stumbling without falling, unspecified, initial encounter: Secondary | ICD-10-CM | POA: Insufficient documentation

## 2023-02-22 DIAGNOSIS — S0181XA Laceration without foreign body of other part of head, initial encounter: Secondary | ICD-10-CM | POA: Diagnosis not present

## 2023-02-22 DIAGNOSIS — Z23 Encounter for immunization: Secondary | ICD-10-CM | POA: Diagnosis not present

## 2023-02-22 MED ORDER — LIDOCAINE HCL (PF) 1 % IJ SOLN
5.0000 mL | Freq: Once | INTRAMUSCULAR | Status: AC
  Administered 2023-02-22: 5 mL via INTRADERMAL
  Filled 2023-02-22: qty 5

## 2023-02-22 MED ORDER — TETANUS-DIPHTH-ACELL PERTUSSIS 5-2.5-18.5 LF-MCG/0.5 IM SUSY
0.5000 mL | PREFILLED_SYRINGE | Freq: Once | INTRAMUSCULAR | Status: AC
  Administered 2023-02-22: 0.5 mL via INTRAMUSCULAR

## 2023-02-22 MED ORDER — CEPHALEXIN 500 MG PO CAPS: 500.0000 mg | ORAL_CAPSULE | Freq: Two times a day (BID) | ORAL | 0 refills | Status: AC

## 2023-02-22 NOTE — ED Triage Notes (Signed)
Pt presents to ED with c/o of lac to chin area, bleeding controlled at this time. Pt states he tripped over a step and hit his chin on a chair, bandage in place at this time. Pt denies blood thinner use.

## 2023-02-22 NOTE — Discharge Instructions (Signed)
Return to ED or urgent care or your primary care physician in 7 days for suture removal.   Keep sutures dry for first 24 hrs. Keep suture site of clean & dry. Gently use soap & water after first 24 hrs. DO NOT USE alcohol, hydrogen peroxide etc, to clean skin. You may cover the incision with clean gauze & replace it after your daily shower for your comfort.   Being treated in the ER is only one step in your treatment and safety! Even if you feel better, you still need to go to all suggested follow-up appointments and take medications as directed. This will help you recover and prevent future problems. Make sure to make and go to all appointments, and call your doctor if things are not going as expected.   If you have been prescribed antibiotics, take them exactly as directed. Do not stop taking them because your symptoms have improved. Take over-the-counter Tylenol or Ibuprofen with food or snacks as needed for pain.

## 2023-02-22 NOTE — ED Provider Notes (Signed)
[facility] Emergency Department Provider Note     Event Date/Time   First MD Initiated Contact with Patient 02/22/23 1116     (approximate)   History   Laceration   HPI  Carl Lowe is a 52 y.o. male with a past medical history of anxiety, hypertension, benzodiazepine abuse who presents to the ED for laceration closure to the right side of his jaw.  Patient was seen at urgent care who instructed him to be evaluated at the ED.  He was provided tetanus and wound care at urgent care.  Patient denies head injury and LOC.  No anticoagulation use.     Physical Exam   Triage Vital Signs: ED Triage Vitals  Encounter Vitals Group     BP 02/22/23 1110 (!) 137/94     Systolic BP Percentile --      Diastolic BP Percentile --      Pulse Rate 02/22/23 1110 71     Resp 02/22/23 1110 18     Temp 02/22/23 1110 97.8 F (36.6 C)     Temp Source 02/22/23 1110 Oral     SpO2 02/22/23 1110 98 %     Weight --      Height --      Head Circumference --      Peak Flow --      Pain Score 02/22/23 1010 3     Pain Loc --      Pain Education --      Exclude from Growth Chart --     Most recent vital signs: Vitals:   02/22/23 1110  BP: (!) 137/94  Pulse: 71  Resp: 18  Temp: 97.8 F (36.6 C)  SpO2: 98%    General Awake, no distress.  Well-appearing. HEENT NCAT. PERRL. EOMI.  CV:  Good peripheral perfusion.  RESP:  Normal effort.  ABD:  No distention.  Other:  ~ 4 cm laceration along anterior-lateral aspect of right mandible. No through and through laceration in internal oral mucosa.   Mandible is mobile in all planes.  ED Results / Procedures / Treatments   Labs (all labs ordered are listed, but only abnormal results are displayed) Labs Reviewed - No data to display  RADIOLOGY  I personally viewed and evaluated these images as part of my medical decision making, as well as reviewing the written report by the radiologist.  ED Provider  Interpretation:   No results found.  PROCEDURES:  Critical Care performed: No  ..Laceration Repair  Date/Time: 02/22/2023 12:43 PM  Performed by: Conrad Francisco, PA-C Authorized by: Conrad , PA-C   Consent:    Consent obtained:  Verbal   Consent given by:  Patient   Risks discussed:  Infection, pain and poor cosmetic result Anesthesia:    Anesthesia method:  Local infiltration   Local anesthetic:  Lidocaine 1% w/o epi Laceration details:    Location:  Face   Face location:  Chin   Length (cm):  4   Depth (mm):  1 Exploration:    Hemostasis achieved with:  Direct pressure Treatment:    Area cleansed with:  Povidone-iodine   Irrigation solution:  Sterile saline   Layers/structures repaired:  Deep dermal/superficial fascia Deep dermal/superficial fascia:    Suture size:  4-0   Suture material:  Monocryl   Suture technique:  Simple interrupted   Number of sutures:  1 Skin repair:    Repair method:  Sutures   Suture size:  4-0   Suture material:  Nylon   Suture technique:  Simple interrupted   Number of sutures:  5 Repair type:    Repair type:  Intermediate Post-procedure details:    Dressing:  Non-adherent dressing   Procedure completion:  Tolerated   MEDICATIONS ORDERED IN ED: Medications  lidocaine (PF) (XYLOCAINE) 1 % injection 5 mL (5 mLs Intradermal Given 02/22/23 1221)   IMPRESSION / MDM / ASSESSMENT AND PLAN / ED COURSE  I reviewed the triage vital signs and the nursing notes.                               52 y.o. male presents to the emergency department for evaluation and treatment of right chin laceration on anterior lateral aspect. See HPI for further details.   Differential diagnosis includes, but is not limited to, laceration, infection, mandible fracture.  Patient's presentation is most consistent with acute illness / injury with system symptoms.  Laceration depth fully visualized.  Patient presents with a approximately 4 cm  laceration along the anterior lateral aspect of the right mandible.  Patient is able to easily move his jaw up and down and lateral rotation.  X-rays not indicated.  Laceration repair tolerated well.  Patient is instructed to return to ED or urgent care in 7 days for suture removal.  Patient will be discharged home with prescriptions for Keflex.  Laceration and suture care at home instructions provided. Patient is given ED precautions to return to the ED for any worsening or new symptoms. Patient verbalizes understanding. All questions and concerns were addressed during ED visit.     FINAL CLINICAL IMPRESSION(S) / ED DIAGNOSES   Final diagnoses:  Facial laceration, initial encounter    Rx / DC Orders   ED Discharge Orders          Ordered    cephALEXin (KEFLEX) 500 MG capsule  2 times daily        02/22/23 1212             Note:  This document was prepared using Dragon voice recognition software and may include unintentional dictation errors.    Kern Reap A, PA-C 02/22/23 1256    Minna Antis, MD 02/22/23 2002

## 2023-02-22 NOTE — Discharge Instructions (Addendum)
Please go to the emergency department at [facility] for evaluation of your facial laceration and multilevel closure.  You also may require imaging of your mandible which cannot be provided here at urgent care.  Nothing to eat or drink until after you been evaluated in the ER.

## 2023-02-22 NOTE — ED Triage Notes (Addendum)
Pt c/o laceration in chin due to fall. States he slipped from his stairs this AM & hit chin against guitar stool. Denies any head injury or LOC. Last tetanus 11/10/13

## 2023-02-22 NOTE — ED Provider Notes (Signed)
[facility]    CSN: [id number] Arrival date & time: 02/22/23  0809      History   Chief Complaint Chief Complaint  Patient presents with   Laceration   Fall    HPI CLAUDY SOTO is a 52 y.o. male.   HPI  -year-old male with a past medical he significant for anxiety, GERD, hypertension, benzodiazepine abuse presents for evaluation of a laceration to the right side of his mandible.  He reports that he slipped going down the stairs this morning and struck her catarrh stool.  He denies any loss of consciousness.  His last tetanus shot was in 2015.  Past Medical History:  Diagnosis Date   Anxiety    Depression     Patient Active Problem List   Diagnosis Date Noted   Benzodiazepine abuse (HCC) 01/16/2023   Hallucinations 06/06/2022   Acute psychosis (HCC) 06/05/2022   Acute encephalopathy 06/05/2022   Hypertension 11/10/2020   Acid reflux 11/10/2020   Anxiety 11/10/2020    Past Surgical History:  Procedure Laterality Date   CARDIAC SURGERY         Home Medications    Prior to Admission medications   Medication Sig Start Date End Date Taking? Authorizing Provider  escitalopram (LEXAPRO) 5 MG tablet Take 1 tablet by mouth daily. 01/26/23 03/27/23 Yes [provider]  folic acid (FOLVITE) 1 MG tablet Take 1 tablet by mouth daily. 01/26/23 01/26/24 Yes [provider]  hydrochlorothiazide (HYDRODIURIL) 12.5 MG tablet Take 1 tablet (12.5 mg total) by mouth daily. 02/02/23  Yes Mecum, Erin E, PA-C  melatonin 3 MG TABS tablet Take by mouth. 01/25/23  Yes [provider]  thiamine (VITAMIN B1) 100 MG tablet Take 1 tablet by mouth daily. 01/26/23  Yes [provider]  pantoprazole (PROTONIX) 40 MG tablet Take 1 tablet (40 mg total) by mouth daily. Patient not taking: Reported on 02/02/2023 06/09/22   Clapacs, Jackquline Denmark, MD    Family History Family History  Problem Relation Age of Onset   Hypertension Mother    Bipolar disorder  Father    Heart disease Father    Dementia Father    COPD Father    Anxiety disorder Brother     Social History Social History   Tobacco Use   Smoking status: Former   Smokeless tobacco: Current    Types: Snuff  Vaping Use   Vaping status: Never Used  Substance Use Topics   Alcohol use: Never   Drug use: Yes    Types: Benzodiazepines, Marijuana    Comment: clonapen, xanax "any benzos" off the street, doesnt' want to do that anymore     Allergies   Patient has no known allergies.   Review of Systems Review of Systems  Skin:  Positive for wound. Negative for color change.     Physical Exam Triage Vital Signs ED Triage Vitals  Encounter Vitals Group     BP      Systolic BP Percentile      Diastolic BP Percentile      Pulse      Resp      Temp      Temp src      SpO2      Weight      Height      Head Circumference      Peak Flow      Pain Score      Pain Loc      Pain Education  Exclude from Growth Chart    No data found.  Updated Vital Signs BP (!) 144/81 (BP Location: Left Arm)   Pulse 82   Temp 98.2 F (36.8 C) (Oral)   Resp 16   Ht 5\' 7"  (1.702 m)   Wt 168 lb (76.2 kg)   SpO2 98%   BMI 26.31 kg/m   Visual Acuity Right Eye Distance:   Left Eye Distance:   Bilateral Distance:    Right Eye Near:   Left Eye Near:    Bilateral Near:     Physical Exam Vitals and nursing note reviewed.  Constitutional:      Appearance: Normal appearance. He is not ill-appearing.  HENT:     Head: Normocephalic.     Comments: 5 cm laceration horizontally along the right mandible. Skin:    General: Skin is warm and dry.     Capillary Refill: Capillary refill takes less than 2 seconds.  Neurological:     General: No focal deficit present.     Mental Status: He is alert and oriented to person, place, and time.      UC Treatments / Results  Labs (all labs ordered are listed, but only abnormal results are displayed) Labs Reviewed - No data to  display  EKG   Radiology No results found.  Procedures Procedures (including critical care time)  Medications Ordered in UC Medications  Tdap (BOOSTRIX) injection 0.5 mL (has no administration in time range)    Initial Impression / Assessment and Plan / UC Course  I have reviewed the triage vital signs and the nursing notes.  Pertinent labs & imaging results that were available during my care of the patient were reviewed by me and considered in my medical decision making (see chart for details).   Patient is a nontoxic-appearing 52 year old male presenting for evaluation of a laceration along his mandible as outlined HPI above.  Patient seen image above, the laceration extends linearly along the mandible.  The mandible appears intact to palpation and no appreciable crepitus.  The wound tracks superiorly up into the mouth and there are bubbles coming from the wound which suggest possible communication with the mouth.  No visible blood in the intraoral cavity.  We will update the patient's tetanus shot and apply a dressing.  After conferring with Dr. Rachael Darby it was agreed that the patient needs to be evaluated in the emergency department for possible imaging as well as possible multilayer closure which we cannot perform here at urgent care.  Patient has elected to go to [facility] regional.  Final Clinical Impressions(s) / UC Diagnoses   Final diagnoses:  Fall, initial encounter  Facial laceration, initial encounter     Discharge Instructions      Please go to the emergency department at [facility] for evaluation of your facial laceration and multilevel closure.  You also may require imaging of your mandible which cannot be provided here at urgent care.  Nothing to eat or drink until after you been evaluated in the ER.     ED Prescriptions   None    PDMP not reviewed this encounter.   Becky Augusta, NP 02/22/23 [phone]

## 2023-03-09 ENCOUNTER — Ambulatory Visit
Admission: EM | Admit: 2023-03-09 | Discharge: 2023-03-09 | Disposition: A | Discharge status: Home / Self Care | Payer: 59 | Attending: Family Medicine | Admitting: Family Medicine

## 2023-03-09 DIAGNOSIS — S0181XD Laceration without foreign body of other part of head, subsequent encounter: Secondary | ICD-10-CM | POA: Diagnosis not present

## 2023-03-09 DIAGNOSIS — Z4802 Encounter for removal of sutures: Secondary | ICD-10-CM

## 2023-03-09 NOTE — ED Provider Notes (Signed)
[facility]    CSN: [id number] Arrival date & time: 03/09/23  1339      History   Chief Complaint No chief complaint on file.   HPI Carl Lowe is a 52 y.o. male.   HPI  Carl Lowe presents for suture removal after falling about 2 weeks ago. The area itches but is not red or draining fluid.  States he had 5 sutures placed at [facility] ED.      Past Medical History:  Diagnosis Date   Anxiety    Depression     Patient Active Problem List   Diagnosis Date Noted   Benzodiazepine abuse (HCC) 01/16/2023   Hallucinations 06/06/2022   Acute psychosis (HCC) 06/05/2022   Acute encephalopathy 06/05/2022   Hypertension 11/10/2020   Acid reflux 11/10/2020   Anxiety 11/10/2020    Past Surgical History:  Procedure Laterality Date   CARDIAC SURGERY         Home Medications    Prior to Admission medications   Medication Sig Start Date End Date Taking? Authorizing Provider  escitalopram (LEXAPRO) 5 MG tablet Take 1 tablet by mouth daily. 01/26/23 03/27/23  [provider]  folic acid (FOLVITE) 1 MG tablet Take 1 tablet by mouth daily. 01/26/23 01/26/24  [provider]  hydrochlorothiazide (HYDRODIURIL) 12.5 MG tablet Take 1 tablet (12.5 mg total) by mouth daily. 02/02/23   Mecum, Erin E, PA-C  melatonin 3 MG TABS tablet Take by mouth. 01/25/23   [provider]  thiamine (VITAMIN B1) 100 MG tablet Take 1 tablet by mouth daily. 01/26/23   [provider]    Family History Family History  Problem Relation Age of Onset   Hypertension Mother    Bipolar disorder Father    Heart disease Father    Dementia Father    COPD Father    Anxiety disorder Brother     Social History Social History   Tobacco Use   Smoking status: Former   Smokeless tobacco: Current    Types: Snuff  Vaping Use   Vaping status: Never Used  Substance Use Topics   Alcohol use: Never   Drug use: Yes    Types: Benzodiazepines, Marijuana     Comment: clonapen, xanax "any benzos" off the street, doesnt' want to do that anymore     Allergies   Patient has no known allergies.   Review of Systems Review of Systems :negative unless otherwise stated in HPI.      Physical Exam Triage Vital Signs ED Triage Vitals [03/09/23 1403]  Encounter Vitals Group     BP 130/80     Systolic BP Percentile      Diastolic BP Percentile      Pulse Rate 83     Resp      Temp 97.9 F (36.6 C)     Temp Source Oral     SpO2 97 %     Weight      Height      Head Circumference      Peak Flow      Pain Score 0     Pain Loc      Pain Education      Exclude from Growth Chart    No data found.  Updated Vital Signs BP 130/80 (BP Location: Left Arm)   Pulse 83   Temp 97.9 F (36.6 C) (Oral)   SpO2 97%   Visual Acuity Right Eye Distance:   Left Eye Distance:   Bilateral  Distance:    Right Eye Near:   Left Eye Near:    Bilateral Near:     Physical Exam  GEN: well appearing male in no acute distress  CVS: well perfused  RESP: speaking in full sentences without pause, no respiratory distress  SKIN: right mandibular area with 4 visible black sutures, no erythema, fluctuance or discharge   UC Treatments / Results  Labs (all labs ordered are listed, but only abnormal results are displayed) Labs Reviewed - No data to display  EKG   Radiology No results found.  Procedures Procedures (including critical care time)  Medications Ordered in UC Medications - No data to display  Initial Impression / Assessment and Plan / UC Course  I have reviewed the triage vital signs and the nursing notes.  Pertinent labs & imaging results that were available during my care of the patient were reviewed by me and considered in my medical decision making (see chart for details).     Patient is a 52 y.o. malewho presents for suture removal.  Overall, patient is well-appearing and well-hydrated.  Vital signs stable.  Carl Lowe is  afebrile.  He had 5 sutures placed at [facility] ED.  Four sutures removed. Pt believes one of them fell out as he recalls a black sting with "booger like substance" on it.  No concern for infection at this time.    Reviewed expectations regarding course of current medical issues.  All questions asked were answered.  Outlined signs and symptoms indicating need for more acute intervention. Patient verbalized understanding. After Visit Summary given.   Final Clinical Impressions(s) / UC Diagnoses   Final diagnoses:  Facial laceration, subsequent encounter  Encounter for removal of sutures     Discharge Instructions      See suture removal care after handout attached.  Follow up with your primary care provider as needed.      ED Prescriptions   None    PDMP not reviewed this encounter.              Katha Cabal, DO 03/09/23 1439

## 2023-03-09 NOTE — ED Triage Notes (Signed)
Pt presents to UC for suture removal from chin area, sutures placed at [facility] in 02/22/23.

## 2023-03-09 NOTE — Discharge Instructions (Signed)
See suture removal care after handout attached.  Follow up with your primary care provider as needed.

## 2023-03-30 ENCOUNTER — Ambulatory Visit: Payer: 59 | Admitting: Physician Assistant

## 2023-08-06 DIAGNOSIS — H6122 Impacted cerumen, left ear: Secondary | ICD-10-CM | POA: Diagnosis not present

## 2024-02-22 ENCOUNTER — Other Ambulatory Visit: Payer: Self-pay

## 2024-02-22 ENCOUNTER — Emergency Department
Admission: EM | Admit: 2024-02-22 | Discharge: 2024-02-22 | Disposition: A | Discharge status: Home / Self Care | Attending: Emergency Medicine | Admitting: Emergency Medicine

## 2024-02-22 DIAGNOSIS — F411 Generalized anxiety disorder: Secondary | ICD-10-CM | POA: Insufficient documentation

## 2024-02-22 LAB — URINE DRUG SCREEN, QUALITATIVE (ARMC ONLY)
Amphetamines, Ur Screen: NOT DETECTED
Barbiturates, Ur Screen: NOT DETECTED
Benzodiazepine, Ur Scrn: POSITIVE — AB
Cannabinoid 50 Ng, Ur ~~LOC~~: POSITIVE — AB
Cocaine Metabolite,Ur ~~LOC~~: NOT DETECTED
MDMA (Ecstasy)Ur Screen: NOT DETECTED
Methadone Scn, Ur: NOT DETECTED
Opiate, Ur Screen: NOT DETECTED
Phencyclidine (PCP) Ur S: NOT DETECTED
Tricyclic, Ur Screen: NOT DETECTED

## 2024-02-22 LAB — CBC
HCT: 46 % (ref 39.0–52.0)
Hemoglobin: 15.8 g/dL (ref 13.0–17.0)
MCH: 29.4 pg (ref 26.0–34.0)
MCHC: 34.3 g/dL (ref 30.0–36.0)
MCV: 85.5 fL (ref 80.0–100.0)
Platelets: 337 K/uL (ref 150–400)
RBC: 5.38 MIL/uL (ref 4.22–5.81)
RDW: 12.7 % (ref 11.5–15.5)
WBC: 15.9 K/uL — ABNORMAL HIGH (ref 4.0–10.5)
nRBC: 0 % (ref 0.0–0.2)

## 2024-02-22 LAB — COMPREHENSIVE METABOLIC PANEL WITH GFR
ALT: 15 U/L (ref 0–44)
AST: 24 U/L (ref 15–41)
Albumin: 4.7 g/dL (ref 3.5–5.0)
Alkaline Phosphatase: 80 U/L (ref 38–126)
Anion gap: 12 (ref 5–15)
BUN: 13 mg/dL (ref 6–20)
CO2: 22 mmol/L (ref 22–32)
Calcium: 9.7 mg/dL (ref 8.9–10.3)
Chloride: 102 mmol/L (ref 98–111)
Creatinine, Ser: 0.69 mg/dL (ref 0.61–1.24)
GFR, Estimated: >60 mL/min (ref >60)
Glucose, Bld: 143 mg/dL — ABNORMAL HIGH (ref 70–99)
Potassium: 3.6 mmol/L (ref 3.5–5.1)
Sodium: 136 mmol/L (ref 135–145)
Total Bilirubin: 0.6 mg/dL (ref 0.0–1.2)
Total Protein: 9 g/dL — ABNORMAL HIGH (ref 6.5–8.1)

## 2024-02-22 LAB — ETHANOL: Alcohol, Ethyl (B): <15 mg/dL (ref <15)

## 2024-02-22 LAB — ACETAMINOPHEN LEVEL: Acetaminophen (Tylenol), Serum: <10 ug/mL — ABNORMAL LOW (ref 10–30)

## 2024-02-22 LAB — SALICYLATE LEVEL: Salicylate Lvl: <7 mg/dL — ABNORMAL LOW (ref 7.0–30.0)

## 2024-02-22 MED ORDER — ALPRAZOLAM 0.5 MG PO TABS: 0.5000 mg | ORAL_TABLET | Freq: Once | ORAL | Status: DC

## 2024-02-22 MED ORDER — LORAZEPAM 1 MG PO TABS
1.0000 mg | ORAL_TABLET | Freq: Once | ORAL | Status: AC
  Administered 2024-02-22: 1 mg via ORAL
  Filled 2024-02-22: qty 1

## 2024-02-22 MED ORDER — GABAPENTIN 600 MG PO TABS: 600.0000 mg | ORAL_TABLET | Freq: Four times a day (QID) | ORAL | 0 refills | Status: AC

## 2024-02-22 MED ORDER — QUETIAPINE FUMARATE 50 MG PO TABS: 50.0000 mg | ORAL_TABLET | Freq: Every day | ORAL | 0 refills | Status: AC

## 2024-02-22 NOTE — ED Provider Notes (Signed)
 Patient has been seen and evaluated by psychiatry.  They recommend discharging the patient on gabapentin  and Seroquel .  They have given the recommended doses which I will prescribe for the patient.  We will give outpatient resources to [facility] as well as [facility].  Medical workup has been largely nonrevealing.   Dorothyann Drivers, MD 02/22/24 1610

## 2024-02-22 NOTE — ED Notes (Signed)
 See triage note  Pt is requesting a refill of Xanax   States he wa staking it twice a day Last dose was about 6 days Pt is very anxious  Pacing in room  Talking fast

## 2024-02-22 NOTE — Consult Note (Signed)
 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  He is requesting some medications to help him sleep and with the withdrawal.  Denies any symptoms of mania hypomania psychosis denies any suicidal thoughts  Mental status examination 53 year old male who appears stated age alert oriented x 3 mood is okay affect is full thought process is logical and current denies any suicidal thoughts Insight and judgment fair  Assessment benzodiazepine withdrawal/opiate withdrawal  Recommendations will recommend prescribing him Neurontin  600 mg p.o. 4 times a day for 1 month supply and also Seroquel  50 mg nightly for 1 month supply patient can be discharged to outpatient follow-up and if he has struggled with the symptoms he can come back to the emergency room for further care.  Thank you for the consult Blood pressure (!) 152/105, pulse (!) 101, temperature 98.1 F (36.7 C), temperature source Oral, resp. rate 18, height 5' 7 (1.702 m), weight 76.2 kg, SpO2 98%.   No current facility-administered medications for this encounter.   Current Outpatient Medications  Medication Sig Dispense Refill   hydrochlorothiazide  (HYDRODIURIL ) 12.5 MG tablet Take 1 tablet (12.5 mg total) by mouth daily. 60 tablet 1   melatonin 3 MG TABS tablet Take by mouth.     thiamine  (VITAMIN B1) 100 MG tablet Take 1 tablet by mouth daily.

## 2024-02-22 NOTE — ED Provider Notes (Signed)
.-----------------------------------------   1:05 PM on 02/22/2024 -----------------------------------------  Blood pressure (!) 152/105, pulse (!) 101, temperature 98.1 F (36.7 C), temperature source Oral, resp. rate 18, height 5' 7 (1.702 m), weight 76.2 kg, SpO2 98%.  Labs are reassuring, patient is medically clear for psychiatric evaluation.    Clinical Course as of 02/22/24 1305  Thu Feb 22, 2024  1304 Independent review of labs, salicylate, ethanol, Tylenol  levels are not elevated, electrolytes not severely deranged, LFTs are normal, mild leukocytosis but he has no other infectious symptoms. [TT]    Clinical Course User Index [TT] Waymond Lorelle Cummins, MD      Waymond Lorelle Cummins, MD 02/22/24 [phone]

## 2024-02-22 NOTE — ED Notes (Signed)
 This tech and [employer] dressed out pt in paper scrubs and placed belongings in pt belongings bag to be secured with quad tech and RN.  Pt belongings: Black IT trainer White socks Keys Camo hat Wallet with 40$ inside  Slide on shoes  Belt  Red plaid underwear

## 2024-02-22 NOTE — Discharge Instructions (Addendum)
 Please take your medications as prescribed.  Please follow-up with [facility] and [facility] by calling the numbers provided.  Return to the emergency department for any symptom concerning to yourself.

## 2024-02-22 NOTE — ED Triage Notes (Signed)
 Pt to ED via POV from home. Pt reports increased anxiety x5 days. Pt has been out of xanax  x5 days. Pt reports insomnia for 5 days as well

## 2024-02-22 NOTE — ED Provider Notes (Signed)
 [facility] Emergency Department Provider Note     Event Date/Time   First MD Initiated Contact with Patient 02/22/24 1024     (approximate)   History   Anxiety   HPI  Carl Lowe is a 53 y.o. male with a history of anxiety, depression, acute psychosis and benzodiazepine abuse presents to the ED for evaluation of increased anxiety over the past 5 days.  Patient reports he ran out of his Xanax  5 days ago and is requesting a prescription for alprazolam  for his anxiety. He also reports insomnia for 5 days.  He reports having tried multiple medications in the past for his insomnia and different SSRIs that he states do not work. Denies SI or HI, however states he does not feel safe. He does not have a psychiatrist he follows and states he has not seen his PCP in over a year. He is requesting psych consult here in the ED. Denies chest pain and shortness of breath.   Chart reviewed -the patient was admitted in inpatient psychiatry in June of last year at [facility] healthcare for visual hallucinations and benzodiazepine withdrawal with perceptual disturbances.     Physical Exam   Triage Vital Signs: ED Triage Vitals  Encounter Vitals Group     BP 02/22/24 1001 (!) 152/105     Girls Systolic BP Percentile --      Girls Diastolic BP Percentile --      Boys Systolic BP Percentile --      Boys Diastolic BP Percentile --      Pulse Rate 02/22/24 1001 (!) 101     Resp 02/22/24 1001 18     Temp 02/22/24 1001 98.1 F (36.7 C)     Temp Source 02/22/24 1001 Oral     SpO2 02/22/24 1001 98 %     Weight 02/22/24 1022 167 lb 15.9 oz (76.2 kg)     Height 02/22/24 1022 5' 7 (1.702 m)     Head Circumference --      Peak Flow --      Pain Score 02/22/24 1000 0     Pain Loc --      Pain Education --      Exclude from Growth Chart --     Most recent vital signs: Vitals:   02/22/24 1001  BP: (!) 152/105  Pulse: (!) 101  Resp: 18  Temp: 98.1 F (36.7 C)   SpO2: 98%    General Severely anxious. awake, no distress.  HEENT NCAT.  CV:  Good peripheral perfusion.  RRR RESP:  Normal effort.  LCTAB ABD:  No distention.   ED Results / Procedures / Treatments   Labs (all labs ordered are listed, but only abnormal results are displayed) Labs Reviewed  COMPREHENSIVE METABOLIC PANEL WITH GFR  ETHANOL  CBC  URINE DRUG SCREEN, QUALITATIVE ([facility] ONLY)  SALICYLATE LEVEL  ACETAMINOPHEN  LEVEL    No results found.  PROCEDURES:  Critical Care performed: No  Procedures   MEDICATIONS ORDERED IN ED: Medications  LORazepam  (ATIVAN ) tablet 1 mg (has no administration in time range)     IMPRESSION / MDM / ASSESSMENT AND PLAN / ED COURSE  I reviewed the triage vital signs and the nursing notes.                               53 y.o. male presents to the emergency department for evaluation and treatment  of worsening anxiety. See HPI for further details.   Differential diagnosis includes, but is not limited to psychosis, anxiety, benzodiazepine abuse  Patient's presentation is most consistent with acute complicated illness / injury requiring diagnostic workup.  Patient presents with worsening anxiety over the past 5 days after running out of his alprazolam  which he has a history of benzodiazepine use and possible misuse.  He reports difficulty sleeping and states other medications have not been effective.  Denies SI or HI at this time.  Patient is medically cleared.  No acute medical issues identified.  Due to the complexity of his psychiatric history and ongoing symptoms psychiatry consult was requested for further evaluation and guidance of management.  Discussed with the patient that I did not feel comfortable prescribing benzodiazepine for him as he does not follow a psychiatrist at this time.   Review of the [place] CSRS was performed in accordance of the [facility] prior to dispensing any controlled drugs.  Patient has not had a controlled substance  prescription since 2023.   Given his high risk of misuse and history I do believe he will benefit from psychiatry services.  Patient will be dressed out and transferred to main side area awaiting psych consult for further assistance in patients care. The patient is in agreement with this plan and is voluntary.   FINAL CLINICAL IMPRESSION(S) / ED DIAGNOSES   Final diagnoses:  Anxiety state   Rx / DC Orders   ED Discharge Orders     None       Note:  This document was prepared using Dragon voice recognition software and may include unintentional dictation errors.    Margrette, Taylan Mayhan A, PA-C 02/22/24 1154    Waymond Lorelle Cummins, MD 02/22/24 [phone]
